# Patient Record
Sex: Male | Born: 1985 | Race: White | Hispanic: No | Marital: Married | Smoking: Former smoker
Health system: Southern US, Community
[De-identification: ages and names within clinical notes are randomized; demographics above are authoritative.]

## PROBLEM LIST (undated history)

## (undated) DIAGNOSIS — B009 Herpesviral infection, unspecified: Secondary | ICD-10-CM

## (undated) DIAGNOSIS — F988 Other specified behavioral and emotional disorders with onset usually occurring in childhood and adolescence: Secondary | ICD-10-CM

## (undated) DIAGNOSIS — S0990XA Unspecified injury of head, initial encounter: Secondary | ICD-10-CM

## (undated) DIAGNOSIS — R413 Other amnesia: Secondary | ICD-10-CM

## (undated) HISTORY — DX: Other amnesia: R41.3

## (undated) HISTORY — DX: Unspecified injury of head, initial encounter: S09.90XA

## (undated) HISTORY — DX: Herpesviral infection, unspecified: B00.9

## (undated) HISTORY — DX: Other specified behavioral and emotional disorders with onset usually occurring in childhood and adolescence: F98.8

---
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
## 2004-05-26 ENCOUNTER — Emergency Department: Payer: Self-pay | Admitting: Emergency Medicine

## 2006-10-03 ENCOUNTER — Inpatient Hospital Stay (HOSPITAL_COMMUNITY): Admission: AD | Admit: 2006-10-03 | Discharge: 2006-10-07 | Payer: Self-pay

## 2006-10-05 ENCOUNTER — Ambulatory Visit: Payer: Self-pay | Admitting: Physical Medicine & Rehabilitation

## 2006-10-07 ENCOUNTER — Inpatient Hospital Stay (HOSPITAL_COMMUNITY)
Admission: RE | Admit: 2006-10-07 | Discharge: 2006-10-28 | Payer: Self-pay | Admitting: Physical Medicine & Rehabilitation

## 2006-10-07 ENCOUNTER — Ambulatory Visit: Payer: Self-pay | Admitting: Physical Medicine & Rehabilitation

## 2006-11-02 ENCOUNTER — Encounter
Admission: RE | Admit: 2006-11-02 | Discharge: 2007-01-31 | Payer: Self-pay | Admitting: Physical Medicine & Rehabilitation

## 2006-11-26 ENCOUNTER — Ambulatory Visit: Payer: Self-pay | Admitting: Physical Medicine & Rehabilitation

## 2006-11-26 ENCOUNTER — Encounter
Admission: RE | Admit: 2006-11-26 | Discharge: 2007-02-24 | Payer: Self-pay | Admitting: Physical Medicine & Rehabilitation

## 2007-01-22 ENCOUNTER — Ambulatory Visit: Payer: Self-pay | Admitting: Physical Medicine & Rehabilitation

## 2007-04-22 ENCOUNTER — Encounter
Admission: RE | Admit: 2007-04-22 | Discharge: 2007-04-23 | Payer: Self-pay | Admitting: Physical Medicine & Rehabilitation

## 2007-04-22 ENCOUNTER — Ambulatory Visit: Payer: Self-pay | Admitting: Physical Medicine & Rehabilitation

## 2007-08-12 ENCOUNTER — Encounter
Admission: RE | Admit: 2007-08-12 | Discharge: 2007-08-13 | Payer: Self-pay | Admitting: Physical Medicine & Rehabilitation

## 2007-08-12 ENCOUNTER — Ambulatory Visit: Payer: Self-pay | Admitting: Physical Medicine & Rehabilitation

## 2008-10-29 IMAGING — CT CT HEAD W/O CM
1 of 2 series · 13 of 30 positions shown, 17 images · IV contrast (agent unspecified)
Comparison: 10/05/2006

CLINICAL DATA: Subdural and subarachnoid hemorrhage status post fall out of bed.    
 HEAD CT WITHOUT CONTRAST:
TECHNIQUE: Contiguous axial images were obtained from the base of the skull through the vertex according to standard protocol without contrast.

[Series 2: head routine 4.8 h47s · axial · 0.44mm/px · z∈[-139,+4]mm · 13 of 36 slices shown, 17 images]
[im 3/36  brain]
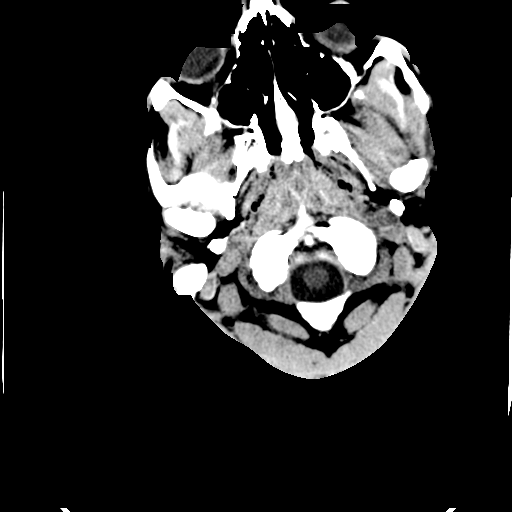
[im 3/36  bone]
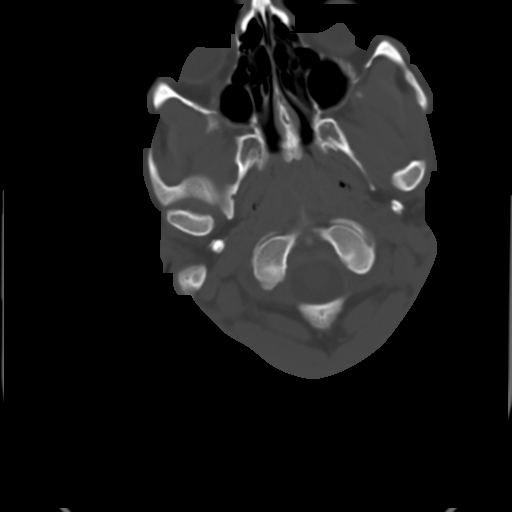
[im 6/36  brain]
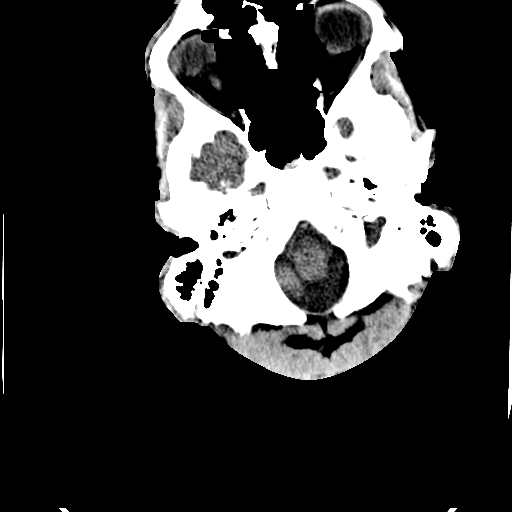
[im 8/36  brain]
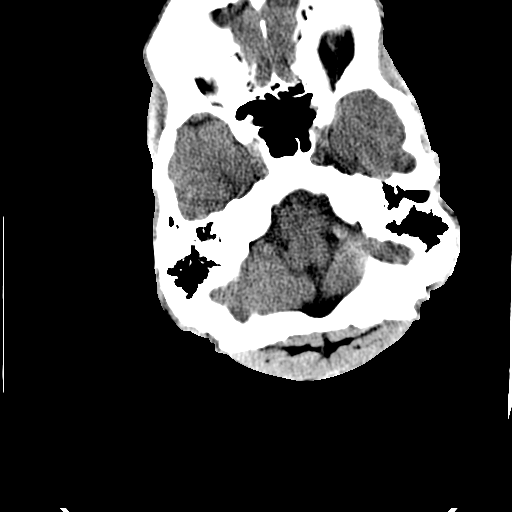
[im 11/36  brain]
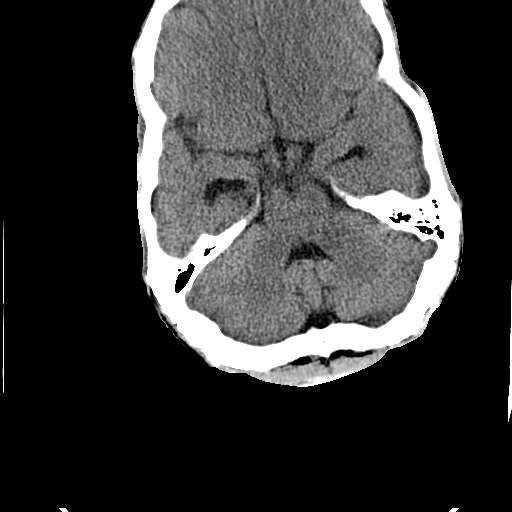
[im 13/36  brain]
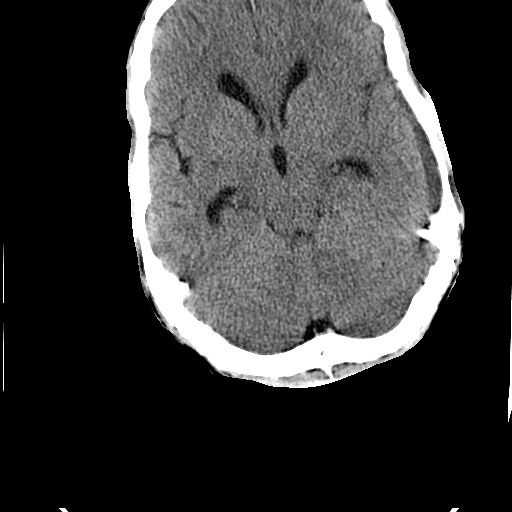
[im 13/36  bone]
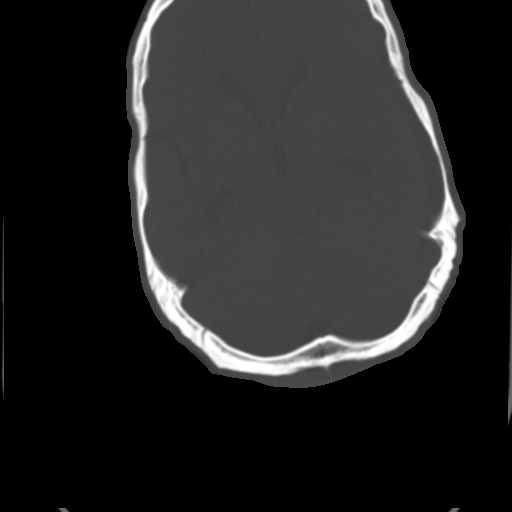
[im 16/36  brain]
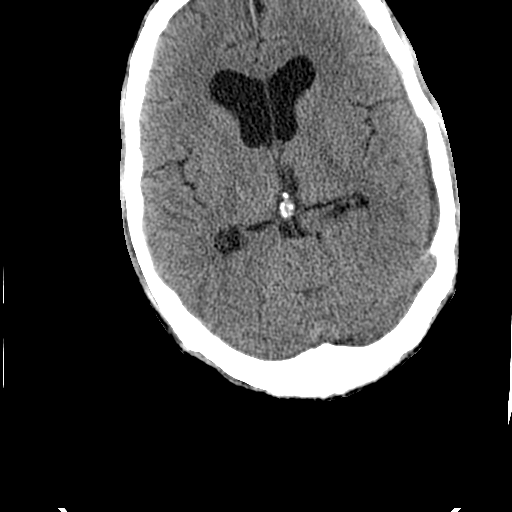
[im 18/36  brain]
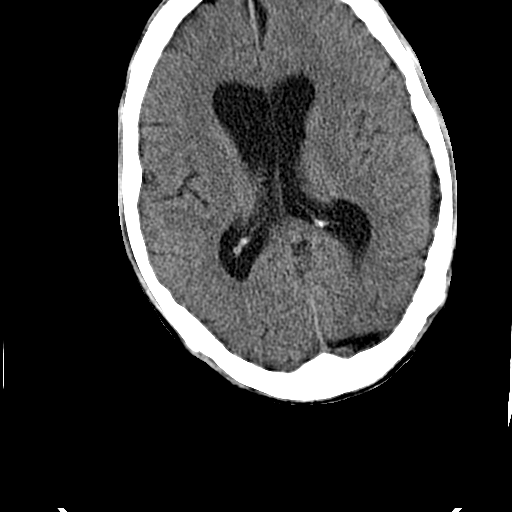
[im 21/36  brain]
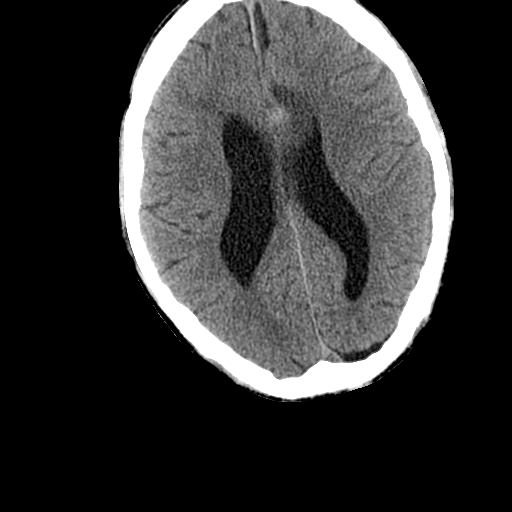
[im 23/36  brain]
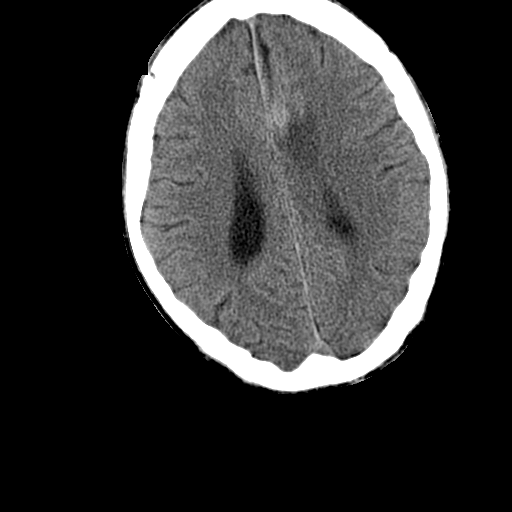
[im 23/36  bone]
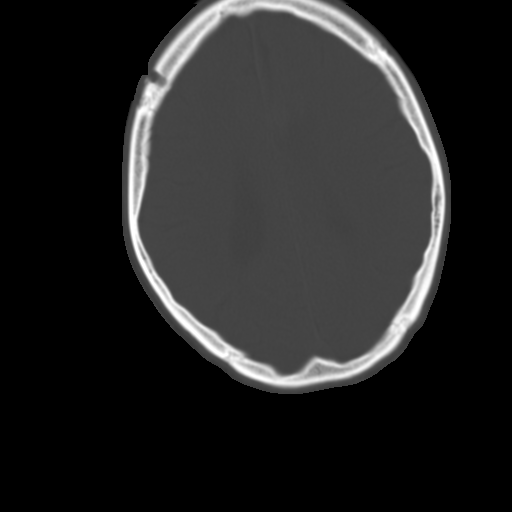
[im 26/36  brain]
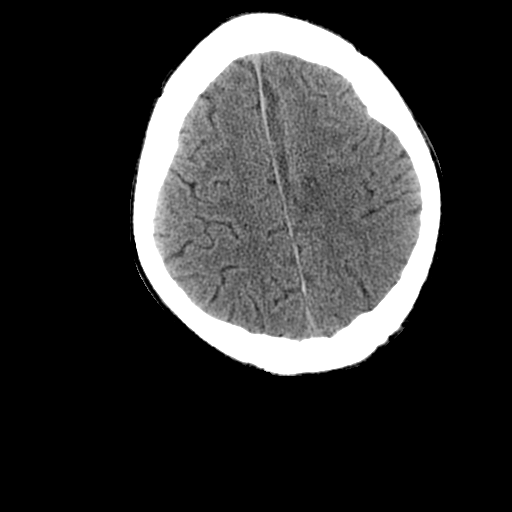
[im 28/36  brain]
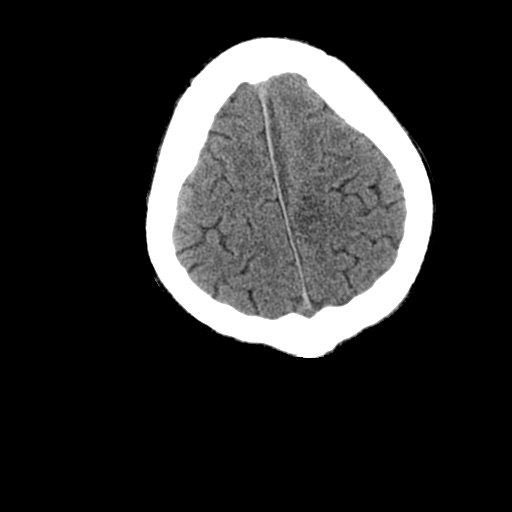
[im 31/36  brain]
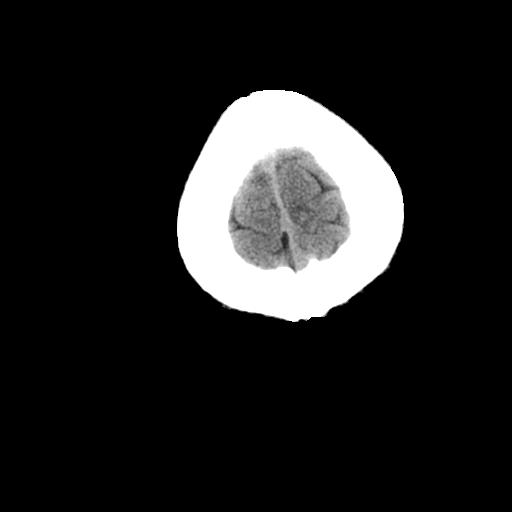
[im 33/36  brain]
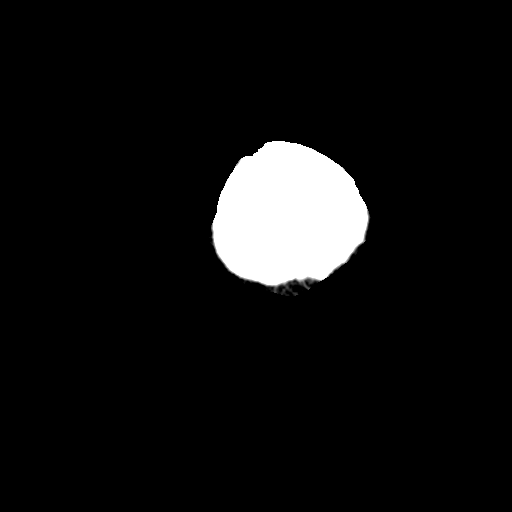
[im 33/36  bone]
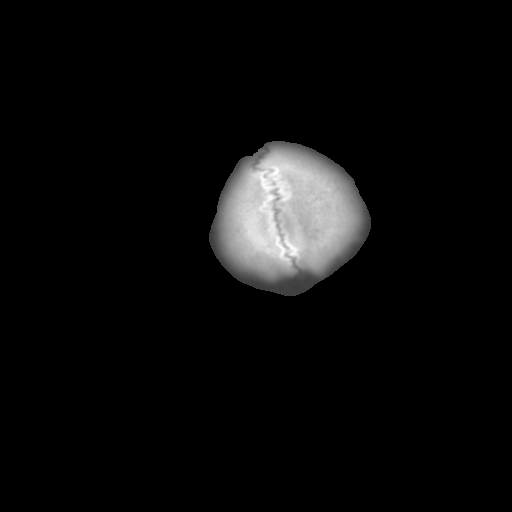

[13 of 30 positions shown; findings below may reference images not displayed]

FINDINGS: Again seen is extraaxial fluid collection over the left convexities and extending along the falx anteriorly.  Degree of high attenuation material within the collection is less and the collection overall is smaller measuring approximately 0.7 cm along the falx superiorly compared to approximately 1.0 cm on the prior study.  Focal hematoma adjacent to the genu of the corpus callosum is essentially unchanged at approximately 1 cm.  There is some surrounding edema.  No new hemorrhage, mass, mass effect, midline shift, or abnormal extraaxial fluid collection.   Ventricles are slightly prominent but not markedly changed.  Tract from prior ventriculostomy shunt catheter or pressure monitor is again noted in the right frontal bone.  Mucous retention cyst versus polyp is seen in the right maxillary sinus and right sphenoid sinus.
IMPRESSION: 1.  Evolutionary change in left subdural hemorrhage without new abnormality. 
 2.  No marked interval change in focal parenchymal hemorrhage in the posteromedial left frontal lobe. 
 3.  Mild prominence of the ventricular system is unchanged.

## 2010-07-21 ENCOUNTER — Encounter: Payer: Self-pay | Admitting: Physical Medicine & Rehabilitation
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
## 2012-02-27 DIAGNOSIS — T799XXS Unspecified early complication of trauma, sequela: Secondary | ICD-10-CM | POA: Diagnosis not present

## 2012-02-27 DIAGNOSIS — F988 Other specified behavioral and emotional disorders with onset usually occurring in childhood and adolescence: Secondary | ICD-10-CM | POA: Diagnosis not present

## 2012-02-27 DIAGNOSIS — F17200 Nicotine dependence, unspecified, uncomplicated: Secondary | ICD-10-CM | POA: Diagnosis not present

## 2013-01-21 DIAGNOSIS — H60509 Unspecified acute noninfective otitis externa, unspecified ear: Secondary | ICD-10-CM | POA: Diagnosis not present

## 2013-05-10 DIAGNOSIS — Z79899 Other long term (current) drug therapy: Secondary | ICD-10-CM | POA: Diagnosis not present

## 2013-05-10 DIAGNOSIS — F988 Other specified behavioral and emotional disorders with onset usually occurring in childhood and adolescence: Secondary | ICD-10-CM | POA: Diagnosis not present

## 2013-06-08 DIAGNOSIS — Z79899 Other long term (current) drug therapy: Secondary | ICD-10-CM | POA: Diagnosis not present

## 2013-06-08 DIAGNOSIS — F988 Other specified behavioral and emotional disorders with onset usually occurring in childhood and adolescence: Secondary | ICD-10-CM | POA: Diagnosis not present

## 2013-09-14 DIAGNOSIS — F988 Other specified behavioral and emotional disorders with onset usually occurring in childhood and adolescence: Secondary | ICD-10-CM | POA: Diagnosis not present

## 2013-09-14 DIAGNOSIS — Z79899 Other long term (current) drug therapy: Secondary | ICD-10-CM | POA: Diagnosis not present

## 2013-11-15 DIAGNOSIS — J309 Allergic rhinitis, unspecified: Secondary | ICD-10-CM | POA: Diagnosis not present

## 2013-11-29 DIAGNOSIS — S0990XA Unspecified injury of head, initial encounter: Secondary | ICD-10-CM | POA: Diagnosis not present

## 2013-11-29 DIAGNOSIS — F988 Other specified behavioral and emotional disorders with onset usually occurring in childhood and adolescence: Secondary | ICD-10-CM | POA: Diagnosis not present

## 2013-12-13 DIAGNOSIS — R413 Other amnesia: Secondary | ICD-10-CM | POA: Diagnosis not present

## 2013-12-13 DIAGNOSIS — S0990XA Unspecified injury of head, initial encounter: Secondary | ICD-10-CM | POA: Diagnosis not present

## 2013-12-13 DIAGNOSIS — S069X5A Unspecified intracranial injury with loss of consciousness greater than 24 hours with return to pre-existing conscious level, initial encounter: Secondary | ICD-10-CM | POA: Diagnosis not present

## 2014-03-01 DIAGNOSIS — Z6826 Body mass index (BMI) 26.0-26.9, adult: Secondary | ICD-10-CM | POA: Diagnosis not present

## 2014-03-01 DIAGNOSIS — F988 Other specified behavioral and emotional disorders with onset usually occurring in childhood and adolescence: Secondary | ICD-10-CM | POA: Diagnosis not present

## 2014-03-01 DIAGNOSIS — S0990XA Unspecified injury of head, initial encounter: Secondary | ICD-10-CM | POA: Diagnosis not present

## 2014-05-30 DIAGNOSIS — Z6827 Body mass index (BMI) 27.0-27.9, adult: Secondary | ICD-10-CM | POA: Diagnosis not present

## 2014-05-30 DIAGNOSIS — S069X9A Unspecified intracranial injury with loss of consciousness of unspecified duration, initial encounter: Secondary | ICD-10-CM | POA: Diagnosis not present

## 2014-06-13 DIAGNOSIS — R0683 Snoring: Secondary | ICD-10-CM | POA: Diagnosis not present

## 2014-06-13 DIAGNOSIS — S069X1D Unspecified intracranial injury with loss of consciousness of 30 minutes or less, subsequent encounter: Secondary | ICD-10-CM | POA: Diagnosis not present

## 2014-06-13 DIAGNOSIS — R413 Other amnesia: Secondary | ICD-10-CM | POA: Diagnosis not present

## 2014-06-19 DIAGNOSIS — R413 Other amnesia: Secondary | ICD-10-CM | POA: Diagnosis not present

## 2014-06-19 DIAGNOSIS — G9389 Other specified disorders of brain: Secondary | ICD-10-CM | POA: Diagnosis not present

## 2014-06-21 DIAGNOSIS — R4182 Altered mental status, unspecified: Secondary | ICD-10-CM | POA: Diagnosis not present

## 2014-06-27 DIAGNOSIS — S069X5A Unspecified intracranial injury with loss of consciousness greater than 24 hours with return to pre-existing conscious level, initial encounter: Secondary | ICD-10-CM | POA: Diagnosis not present

## 2014-06-27 DIAGNOSIS — R0683 Snoring: Secondary | ICD-10-CM | POA: Diagnosis not present

## 2014-06-27 DIAGNOSIS — S0990XD Unspecified injury of head, subsequent encounter: Secondary | ICD-10-CM | POA: Diagnosis not present

## 2014-06-27 DIAGNOSIS — R413 Other amnesia: Secondary | ICD-10-CM | POA: Diagnosis not present

## 2014-08-29 DIAGNOSIS — F900 Attention-deficit hyperactivity disorder, predominantly inattentive type: Secondary | ICD-10-CM | POA: Diagnosis not present

## 2014-08-29 DIAGNOSIS — E785 Hyperlipidemia, unspecified: Secondary | ICD-10-CM | POA: Diagnosis not present

## 2014-08-29 DIAGNOSIS — S069X9A Unspecified intracranial injury with loss of consciousness of unspecified duration, initial encounter: Secondary | ICD-10-CM | POA: Diagnosis not present

## 2014-08-29 DIAGNOSIS — F908 Attention-deficit hyperactivity disorder, other type: Secondary | ICD-10-CM | POA: Diagnosis not present

## 2014-08-29 DIAGNOSIS — Z6827 Body mass index (BMI) 27.0-27.9, adult: Secondary | ICD-10-CM | POA: Diagnosis not present

## 2014-09-09 DIAGNOSIS — R1110 Vomiting, unspecified: Secondary | ICD-10-CM | POA: Diagnosis not present

## 2014-11-28 DIAGNOSIS — S069X9A Unspecified intracranial injury with loss of consciousness of unspecified duration, initial encounter: Secondary | ICD-10-CM | POA: Diagnosis not present

## 2014-11-28 DIAGNOSIS — F900 Attention-deficit hyperactivity disorder, predominantly inattentive type: Secondary | ICD-10-CM | POA: Diagnosis not present

## 2014-11-28 DIAGNOSIS — Z6828 Body mass index (BMI) 28.0-28.9, adult: Secondary | ICD-10-CM | POA: Diagnosis not present

## 2015-01-04 DIAGNOSIS — Z6828 Body mass index (BMI) 28.0-28.9, adult: Secondary | ICD-10-CM | POA: Diagnosis not present

## 2015-01-04 DIAGNOSIS — L559 Sunburn, unspecified: Secondary | ICD-10-CM | POA: Diagnosis not present

## 2015-02-26 DIAGNOSIS — S069X9D Unspecified intracranial injury with loss of consciousness of unspecified duration, subsequent encounter: Secondary | ICD-10-CM | POA: Diagnosis not present

## 2015-02-26 DIAGNOSIS — F900 Attention-deficit hyperactivity disorder, predominantly inattentive type: Secondary | ICD-10-CM | POA: Diagnosis not present

## 2015-02-26 DIAGNOSIS — Z6828 Body mass index (BMI) 28.0-28.9, adult: Secondary | ICD-10-CM | POA: Diagnosis not present

## 2015-05-28 DIAGNOSIS — F900 Attention-deficit hyperactivity disorder, predominantly inattentive type: Secondary | ICD-10-CM | POA: Diagnosis not present

## 2015-05-28 DIAGNOSIS — S069X9D Unspecified intracranial injury with loss of consciousness of unspecified duration, subsequent encounter: Secondary | ICD-10-CM | POA: Diagnosis not present

## 2015-05-28 DIAGNOSIS — Z6828 Body mass index (BMI) 28.0-28.9, adult: Secondary | ICD-10-CM | POA: Diagnosis not present

## 2015-07-10 DIAGNOSIS — R05 Cough: Secondary | ICD-10-CM | POA: Diagnosis not present

## 2015-07-10 DIAGNOSIS — R509 Fever, unspecified: Secondary | ICD-10-CM | POA: Diagnosis not present

## 2015-07-10 DIAGNOSIS — J069 Acute upper respiratory infection, unspecified: Secondary | ICD-10-CM | POA: Diagnosis not present

## 2015-08-27 DIAGNOSIS — Z6828 Body mass index (BMI) 28.0-28.9, adult: Secondary | ICD-10-CM | POA: Diagnosis not present

## 2015-08-27 DIAGNOSIS — F909 Attention-deficit hyperactivity disorder, unspecified type: Secondary | ICD-10-CM | POA: Diagnosis not present

## 2015-08-27 DIAGNOSIS — S069X9D Unspecified intracranial injury with loss of consciousness of unspecified duration, subsequent encounter: Secondary | ICD-10-CM | POA: Diagnosis not present

## 2015-11-22 DIAGNOSIS — F909 Attention-deficit hyperactivity disorder, unspecified type: Secondary | ICD-10-CM | POA: Diagnosis not present

## 2015-11-22 DIAGNOSIS — Z6827 Body mass index (BMI) 27.0-27.9, adult: Secondary | ICD-10-CM | POA: Diagnosis not present

## 2015-11-22 DIAGNOSIS — S069X9D Unspecified intracranial injury with loss of consciousness of unspecified duration, subsequent encounter: Secondary | ICD-10-CM | POA: Diagnosis not present

## 2016-02-20 DIAGNOSIS — S069X9D Unspecified intracranial injury with loss of consciousness of unspecified duration, subsequent encounter: Secondary | ICD-10-CM | POA: Diagnosis not present

## 2016-02-20 DIAGNOSIS — Z6828 Body mass index (BMI) 28.0-28.9, adult: Secondary | ICD-10-CM | POA: Diagnosis not present

## 2016-02-20 DIAGNOSIS — F909 Attention-deficit hyperactivity disorder, unspecified type: Secondary | ICD-10-CM | POA: Diagnosis not present

## 2016-03-18 DIAGNOSIS — S46912A Strain of unspecified muscle, fascia and tendon at shoulder and upper arm level, left arm, initial encounter: Secondary | ICD-10-CM | POA: Diagnosis not present

## 2016-03-18 DIAGNOSIS — S76012A Strain of muscle, fascia and tendon of left hip, initial encounter: Secondary | ICD-10-CM | POA: Diagnosis not present

## 2016-05-21 DIAGNOSIS — F909 Attention-deficit hyperactivity disorder, unspecified type: Secondary | ICD-10-CM | POA: Diagnosis not present

## 2016-05-21 DIAGNOSIS — S069X9D Unspecified intracranial injury with loss of consciousness of unspecified duration, subsequent encounter: Secondary | ICD-10-CM | POA: Diagnosis not present

## 2016-08-20 DIAGNOSIS — F901 Attention-deficit hyperactivity disorder, predominantly hyperactive type: Secondary | ICD-10-CM | POA: Diagnosis not present

## 2016-11-18 DIAGNOSIS — S069X9D Unspecified intracranial injury with loss of consciousness of unspecified duration, subsequent encounter: Secondary | ICD-10-CM | POA: Diagnosis not present

## 2016-11-18 DIAGNOSIS — F909 Attention-deficit hyperactivity disorder, unspecified type: Secondary | ICD-10-CM | POA: Diagnosis not present

## 2017-02-25 DIAGNOSIS — R41840 Attention and concentration deficit: Secondary | ICD-10-CM | POA: Diagnosis not present

## 2017-02-25 DIAGNOSIS — F900 Attention-deficit hyperactivity disorder, predominantly inattentive type: Secondary | ICD-10-CM | POA: Diagnosis not present

## 2017-02-25 DIAGNOSIS — R41844 Frontal lobe and executive function deficit: Secondary | ICD-10-CM | POA: Diagnosis not present

## 2017-02-25 DIAGNOSIS — Z87820 Personal history of traumatic brain injury: Secondary | ICD-10-CM | POA: Diagnosis not present

## 2017-05-16 DIAGNOSIS — J029 Acute pharyngitis, unspecified: Secondary | ICD-10-CM | POA: Diagnosis not present

## 2017-05-29 DIAGNOSIS — Z87820 Personal history of traumatic brain injury: Secondary | ICD-10-CM | POA: Diagnosis not present

## 2017-05-29 DIAGNOSIS — F900 Attention-deficit hyperactivity disorder, predominantly inattentive type: Secondary | ICD-10-CM | POA: Diagnosis not present

## 2017-05-29 DIAGNOSIS — R41840 Attention and concentration deficit: Secondary | ICD-10-CM | POA: Diagnosis not present

## 2017-05-29 DIAGNOSIS — R41844 Frontal lobe and executive function deficit: Secondary | ICD-10-CM | POA: Diagnosis not present

## 2017-09-04 DIAGNOSIS — F900 Attention-deficit hyperactivity disorder, predominantly inattentive type: Secondary | ICD-10-CM | POA: Diagnosis not present

## 2017-09-04 DIAGNOSIS — R41840 Attention and concentration deficit: Secondary | ICD-10-CM | POA: Diagnosis not present

## 2017-09-04 DIAGNOSIS — R41844 Frontal lobe and executive function deficit: Secondary | ICD-10-CM | POA: Diagnosis not present

## 2017-09-04 DIAGNOSIS — Z87820 Personal history of traumatic brain injury: Secondary | ICD-10-CM | POA: Diagnosis not present

## 2017-12-11 DIAGNOSIS — F900 Attention-deficit hyperactivity disorder, predominantly inattentive type: Secondary | ICD-10-CM | POA: Diagnosis not present

## 2017-12-11 DIAGNOSIS — R41844 Frontal lobe and executive function deficit: Secondary | ICD-10-CM | POA: Diagnosis not present

## 2017-12-11 DIAGNOSIS — Z6827 Body mass index (BMI) 27.0-27.9, adult: Secondary | ICD-10-CM | POA: Diagnosis not present

## 2018-03-15 DIAGNOSIS — R41844 Frontal lobe and executive function deficit: Secondary | ICD-10-CM | POA: Diagnosis not present

## 2018-03-15 DIAGNOSIS — Z6826 Body mass index (BMI) 26.0-26.9, adult: Secondary | ICD-10-CM | POA: Diagnosis not present

## 2018-03-15 DIAGNOSIS — F909 Attention-deficit hyperactivity disorder, unspecified type: Secondary | ICD-10-CM | POA: Diagnosis not present

## 2018-06-14 DIAGNOSIS — Z6826 Body mass index (BMI) 26.0-26.9, adult: Secondary | ICD-10-CM | POA: Diagnosis not present

## 2018-06-14 DIAGNOSIS — S069X9D Unspecified intracranial injury with loss of consciousness of unspecified duration, subsequent encounter: Secondary | ICD-10-CM | POA: Diagnosis not present

## 2018-06-14 DIAGNOSIS — R41844 Frontal lobe and executive function deficit: Secondary | ICD-10-CM | POA: Diagnosis not present

## 2018-06-14 DIAGNOSIS — F900 Attention-deficit hyperactivity disorder, predominantly inattentive type: Secondary | ICD-10-CM | POA: Diagnosis not present

## 2018-09-15 DIAGNOSIS — F900 Attention-deficit hyperactivity disorder, predominantly inattentive type: Secondary | ICD-10-CM | POA: Diagnosis not present

## 2018-09-15 DIAGNOSIS — R41844 Frontal lobe and executive function deficit: Secondary | ICD-10-CM | POA: Diagnosis not present

## 2018-09-15 DIAGNOSIS — Z6827 Body mass index (BMI) 27.0-27.9, adult: Secondary | ICD-10-CM | POA: Diagnosis not present

## 2018-12-23 DIAGNOSIS — R41844 Frontal lobe and executive function deficit: Secondary | ICD-10-CM | POA: Diagnosis not present

## 2018-12-23 DIAGNOSIS — F900 Attention-deficit hyperactivity disorder, predominantly inattentive type: Secondary | ICD-10-CM | POA: Diagnosis not present

## 2019-01-20 DIAGNOSIS — B1089 Other human herpesvirus infection: Secondary | ICD-10-CM | POA: Diagnosis not present

## 2019-01-20 DIAGNOSIS — Z1159 Encounter for screening for other viral diseases: Secondary | ICD-10-CM | POA: Diagnosis not present

## 2019-01-20 DIAGNOSIS — Z6826 Body mass index (BMI) 26.0-26.9, adult: Secondary | ICD-10-CM | POA: Diagnosis not present

## 2019-01-20 DIAGNOSIS — B3749 Other urogenital candidiasis: Secondary | ICD-10-CM | POA: Diagnosis not present

## 2019-03-28 DIAGNOSIS — Z87820 Personal history of traumatic brain injury: Secondary | ICD-10-CM | POA: Diagnosis not present

## 2019-03-28 DIAGNOSIS — Z6827 Body mass index (BMI) 27.0-27.9, adult: Secondary | ICD-10-CM | POA: Diagnosis not present

## 2019-03-28 DIAGNOSIS — R41844 Frontal lobe and executive function deficit: Secondary | ICD-10-CM | POA: Diagnosis not present

## 2019-03-28 DIAGNOSIS — F900 Attention-deficit hyperactivity disorder, predominantly inattentive type: Secondary | ICD-10-CM | POA: Diagnosis not present

## 2019-03-28 DIAGNOSIS — Z1159 Encounter for screening for other viral diseases: Secondary | ICD-10-CM | POA: Diagnosis not present

## 2019-05-31 ENCOUNTER — Telehealth: Payer: Self-pay | Admitting: General Practice

## 2019-05-31 NOTE — Telephone Encounter (Signed)
Yes, thanks

## 2019-05-31 NOTE — Telephone Encounter (Signed)
FYI  Patient's mom called today to schedule new patient appointment She stated that her husband and herself are both patients of yours and when her husband Fritz Pickerel was in the office he spoke with you about taking their son as a new patient. Patient has been scheduled

## 2019-06-13 ENCOUNTER — Encounter: Payer: Self-pay | Admitting: Family Medicine

## 2019-06-13 ENCOUNTER — Other Ambulatory Visit: Payer: Self-pay

## 2019-06-13 ENCOUNTER — Ambulatory Visit (INDEPENDENT_AMBULATORY_CARE_PROVIDER_SITE_OTHER): Payer: Medicare Other | Admitting: Family Medicine

## 2019-06-13 VITALS — BP 126/86 | HR 77 | Temp 97.3°F | Ht 72.0 in | Wt 213.3 lb

## 2019-06-13 DIAGNOSIS — S0990XS Unspecified injury of head, sequela: Secondary | ICD-10-CM

## 2019-06-13 DIAGNOSIS — Z7189 Other specified counseling: Secondary | ICD-10-CM

## 2019-06-13 DIAGNOSIS — F988 Other specified behavioral and emotional disorders with onset usually occurring in childhood and adolescence: Secondary | ICD-10-CM | POA: Diagnosis not present

## 2019-06-13 DIAGNOSIS — B009 Herpesviral infection, unspecified: Secondary | ICD-10-CM | POA: Diagnosis not present

## 2019-06-13 DIAGNOSIS — R413 Other amnesia: Secondary | ICD-10-CM | POA: Diagnosis not present

## 2019-06-13 MED ORDER — VALACYCLOVIR HCL 500 MG PO TABS: 500.0000 mg | ORAL_TABLET | Freq: Every day | ORAL | 12 refills | Status: DC | PRN

## 2019-06-13 NOTE — Patient Instructions (Addendum)
Take valtrex once daily for now.  If you have a flare, then take 3 times a day during the flare.  Let me get your records in the meantime.  When you need a refill on adderall, let me know a few days ahead of time (06/27/2019).   Take care.  Glad to see you.
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
## 2019-06-15 ENCOUNTER — Encounter: Payer: Self-pay | Admitting: Family Medicine

## 2019-06-15 DIAGNOSIS — F988 Other specified behavioral and emotional disorders with onset usually occurring in childhood and adolescence: Secondary | ICD-10-CM | POA: Insufficient documentation

## 2019-06-15 DIAGNOSIS — R413 Other amnesia: Secondary | ICD-10-CM | POA: Insufficient documentation

## 2019-06-15 DIAGNOSIS — B009 Herpesviral infection, unspecified: Secondary | ICD-10-CM | POA: Insufficient documentation

## 2019-06-15 DIAGNOSIS — S0990XA Unspecified injury of head, initial encounter: Secondary | ICD-10-CM | POA: Insufficient documentation

## 2019-06-15 DIAGNOSIS — Z7189 Other specified counseling: Secondary | ICD-10-CM | POA: Insufficient documentation
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
## 2019-06-15 NOTE — Assessment & Plan Note (Signed)
He can change to 500 mg daily Valtrex to take 3 times a day only if he has a flare.  Discussed.  He can update me as needed.
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
## 2019-06-15 NOTE — Assessment & Plan Note (Signed)
Advance directive discussed with patient.  Mother designated patient were incapacitated.

## 2019-08-09 ENCOUNTER — Other Ambulatory Visit: Payer: Self-pay | Admitting: Family Medicine

## 2019-08-09 NOTE — Telephone Encounter (Signed)
Patient is requesting a refill   Adderall   Patient is not sure how many he has left.   [facility] [employer] [place]

## 2019-08-10 MED ORDER — AMPHETAMINE-DEXTROAMPHETAMINE 20 MG PO TABS: 20.0000 mg | ORAL_TABLET | Freq: Three times a day (TID) | ORAL | 0 refills | Status: DC

## 2019-08-10 NOTE — Telephone Encounter (Signed)
Patient's mother called back about refill  She stated the patient has moved and now has a new pharmacy. Patient did not mean to give the local pharmacy yesterday and it needed to be corrected     [id number] [place] [place] - [address]

## 2019-08-10 NOTE — Telephone Encounter (Signed)
Name of Medication: Adderall 20 mg Name of Pharmacy: [facility] Last Fill or Written Date and Quantity: ? Hx med; Dr Para March has not previously filled med Last Office Visit and Type: 06/13/2019 establish care Next Office Visit and Type:none scheduled Last Controlled Substance Agreement Date:none  Last FRE:VQWQ

## 2019-08-10 NOTE — Telephone Encounter (Signed)
I sent the prescription under the presumption that the patient would still be able to follow-up here as needed.  If this is not the case then he will need to get set up with another doc.  Thanks.

## 2019-08-11 NOTE — Telephone Encounter (Signed)
Spoke with patient mom, ok per DPR on file, and advised of everything. Mrs Kiner said that patient would follow up here as needed.

## 2019-09-06 ENCOUNTER — Other Ambulatory Visit: Payer: Self-pay | Admitting: Family Medicine

## 2019-09-06 NOTE — Telephone Encounter (Signed)
Name of Medication: Adderall 20 mg  Name of Pharmacy:[facility]  Last Fill or Written Date and Quantity:# 90 on 08/10/19  Last Office Visit and Type: 06/13/19 ADD to establish care Next Office Visit and Type: none scheduled   Request was also left for refill of Valtrex; pt should have available refills for valtrex at [facility]. Unable to speak with pts mom(DPR signed) or pt. When notify pt of Adderall being sent to pharmacy please let him know that [facility]trex [facility] [place] to [facility] pharmacy in [place].

## 2019-09-06 NOTE — Telephone Encounter (Signed)
Patient's mom called to request refill  Adderall & Valtrex   She is not sure how many the patient has left.    [facility] [address]

## 2019-09-07 MED ORDER — AMPHETAMINE-DEXTROAMPHETAMINE 20 MG PO TABS: 20.0000 mg | ORAL_TABLET | Freq: Three times a day (TID) | ORAL | 0 refills | Status: DC

## 2019-09-07 NOTE — Telephone Encounter (Signed)
Patient notified as instructed by telephone and verbalized understanding. 

## 2019-09-07 NOTE — Telephone Encounter (Signed)
Sent. See below.  Thanks.

## 2019-10-05 ENCOUNTER — Other Ambulatory Visit: Payer: Self-pay | Admitting: Family Medicine

## 2019-10-05 MED ORDER — AMPHETAMINE-DEXTROAMPHETAMINE 20 MG PO TABS: 20.0000 mg | ORAL_TABLET | Freq: Three times a day (TID) | ORAL | 0 refills | Status: DC

## 2019-10-05 NOTE — Telephone Encounter (Signed)
Pt needs a refill on his Adderall [facility] [place].

## 2019-10-05 NOTE — Addendum Note (Signed)
Addended by: Joaquim Nam on: 10/05/2019 02:10 PM   Modules accepted: Orders

## 2019-10-05 NOTE — Telephone Encounter (Signed)
Please call patient and schedule appointment as instructed. 

## 2019-10-05 NOTE — Telephone Encounter (Signed)
Name of Medication: Adderall 20 mg  Name of Pharmacy:[facility] [place]  Last Fill or Written Date and Quantity:# 90 on 09/07/19  Last Office Visit and Type: 06/13/19 ADD to establish care Next Office Visit and Type: none scheduled  No UDS or contract on file.

## 2019-10-05 NOTE — Telephone Encounter (Signed)
Sent.  Reasonable to set follow up for this summer.  Thanks.

## 2019-10-05 NOTE — Addendum Note (Signed)
Addended by: Consuella Lose on: 10/05/2019 09:22 AM   Modules accepted: Orders

## 2019-10-06 ENCOUNTER — Telehealth: Payer: Self-pay | Admitting: Family Medicine

## 2019-10-06 NOTE — Telephone Encounter (Signed)
Called patient and left voicemail for patient to call back to schedule a follow up during the summer.

## 2019-10-15 DIAGNOSIS — U071 COVID-19: Secondary | ICD-10-CM | POA: Diagnosis not present

## 2019-10-15 DIAGNOSIS — Z03818 Encounter for observation for suspected exposure to other biological agents ruled out: Secondary | ICD-10-CM | POA: Diagnosis not present

## 2019-10-15 DIAGNOSIS — Z20828 Contact with and (suspected) exposure to other viral communicable diseases: Secondary | ICD-10-CM | POA: Diagnosis not present

## 2019-10-19 NOTE — Telephone Encounter (Signed)
Called patient and left a message for patient to call back and get scheduled for f/u.

## 2019-11-03 ENCOUNTER — Other Ambulatory Visit: Payer: Self-pay | Admitting: Family Medicine

## 2019-11-03 NOTE — Telephone Encounter (Signed)
Name of Medication:Adderall 20 mg  Name of Pharmacy:[facility] [place]  Last Fill or Written Date and Quantity: # 90 on 10/05/19 Last Office Visit and Type:establish care 06/13/19  Next Office Visit and Type: none scheduled

## 2019-11-03 NOTE — Telephone Encounter (Signed)
Patient's wife,Cindy,called to request a refill for patient.  Patient needs a refill for Adderall.  [facility].

## 2019-11-04 MED ORDER — AMPHETAMINE-DEXTROAMPHETAMINE 20 MG PO TABS: 20.0000 mg | ORAL_TABLET | Freq: Three times a day (TID) | ORAL | 0 refills | Status: DC

## 2019-11-04 NOTE — Telephone Encounter (Signed)
ERx 

## 2019-12-07 ENCOUNTER — Other Ambulatory Visit: Payer: Self-pay | Admitting: Family Medicine

## 2019-12-07 MED ORDER — AMPHETAMINE-DEXTROAMPHETAMINE 20 MG PO TABS: 20.0000 mg | ORAL_TABLET | Freq: Three times a day (TID) | ORAL | 0 refills | Status: DC

## 2019-12-07 NOTE — Telephone Encounter (Signed)
Sent. Thanks.   

## 2019-12-07 NOTE — Telephone Encounter (Signed)
Last refill 11/04/19 #90 Last office visit 06/13/19

## 2019-12-07 NOTE — Telephone Encounter (Signed)
Pt needs refill on Adderall sent to [facility]

## 2019-12-22 ENCOUNTER — Encounter: Payer: Self-pay | Admitting: Family Medicine

## 2019-12-22 NOTE — Telephone Encounter (Signed)
Printed out letter with reminder to call office soon to schedule follow up appointment.

## 2020-01-06 ENCOUNTER — Other Ambulatory Visit: Payer: Self-pay | Admitting: Family Medicine

## 2020-01-06 MED ORDER — AMPHETAMINE-DEXTROAMPHETAMINE 20 MG PO TABS: 20.0000 mg | ORAL_TABLET | Freq: Three times a day (TID) | ORAL | 0 refills | Status: DC

## 2020-01-06 NOTE — Telephone Encounter (Signed)
Sent. Thanks.   

## 2020-01-06 NOTE — Telephone Encounter (Signed)
Name of Medication: Adderall 20 mg Name of Pharmacy: [facility] [place] Last Fill or Written Date and Quantity: # 90 on 12/07/19 Last Office Visit and Type: 06/13/19 Est Care ADD Next Office Visit and Type: 01/16/20

## 2020-01-06 NOTE — Telephone Encounter (Signed)
Patient's mother called to request a refill   ADDERALL   She stated the patient is completely out of medication    [facility] [address]

## 2020-01-12 ENCOUNTER — Telehealth: Payer: Self-pay

## 2020-01-12 NOTE — Telephone Encounter (Signed)
LVM that there is no msg in chart that anyone called from this clinic. 

## 2020-01-12 NOTE — Telephone Encounter (Signed)
Sending note to CMA both pts are Dr Duncan's pt. Not sure what call was in reference to. 

## 2020-01-12 NOTE — Telephone Encounter (Signed)
[facility] Night - Client Nonclinical Telephone Record AccessNurse Client [facility] Night - Client Client Site [facility] - Night Physician Duncan, Shaw - MD Contact Type Call Who Is Calling Patient / Member / Family / Caregiver Caller Name [name] Caller Phone Number [phone] Call Type Message Only Information Provided Reason for Call Returning a Call from the Office Initial Comment Caller states he missed a call from the office. Additional Comment his wife is [name] DOB 03/27/1957 and his son [name] DOB 11/07/1984. Provided office hours. He states if the office calls back to leave a message in regards to what the call was for. Disp. Time Disposition Final User 01/11/2020 5:28:23 PM General Information Provided Yes Mclemore, Kendra Call Closed By: Kendra Mclemore Transaction Date/Time: 01/11/2020 5:25:47 PM (ET) 

## 2020-01-16 ENCOUNTER — Other Ambulatory Visit: Payer: Self-pay

## 2020-01-16 ENCOUNTER — Encounter: Payer: Self-pay | Admitting: Family Medicine

## 2020-01-16 ENCOUNTER — Ambulatory Visit (INDEPENDENT_AMBULATORY_CARE_PROVIDER_SITE_OTHER): Payer: Medicare Other | Admitting: Family Medicine

## 2020-01-16 VITALS — BP 122/76 | HR 85 | Temp 97.5°F | Ht 72.0 in | Wt 205.2 lb

## 2020-01-16 DIAGNOSIS — R413 Other amnesia: Secondary | ICD-10-CM | POA: Diagnosis not present

## 2020-01-16 DIAGNOSIS — F988 Other specified behavioral and emotional disorders with onset usually occurring in childhood and adolescence: Secondary | ICD-10-CM

## 2020-01-16 LAB — CBC WITH DIFFERENTIAL/PLATELET
Basophils Absolute: 0.1 10*3/uL (ref 0.0–0.1)
Basophils Relative: 0.9 % (ref 0.0–3.0)
Eosinophils Absolute: 0.2 10*3/uL (ref 0.0–0.7)
Eosinophils Relative: 2.3 % (ref 0.0–5.0)
HCT: 45.3 % (ref 39.0–52.0)
Hemoglobin: 15.3 g/dL (ref 13.0–17.0)
Lymphocytes Relative: 23.1 % (ref 12.0–46.0)
Lymphs Abs: 1.6 10*3/uL (ref 0.7–4.0)
MCHC: 33.7 g/dL (ref 30.0–36.0)
MCV: 82.5 fl (ref 78.0–100.0)
Monocytes Absolute: 0.6 10*3/uL (ref 0.1–1.0)
Monocytes Relative: 8.8 % (ref 3.0–12.0)
Neutro Abs: 4.6 10*3/uL (ref 1.4–7.7)
Neutrophils Relative %: 64.9 % (ref 43.0–77.0)
Platelets: 248 10*3/uL (ref 150.0–400.0)
RBC: 5.49 Mil/uL (ref 4.22–5.81)
RDW: 13.7 % (ref 11.5–15.5)
WBC: 7 10*3/uL (ref 4.0–10.5)

## 2020-01-16 LAB — TSH: TSH: 1.34 u[IU]/mL (ref 0.35–4.50)

## 2020-01-16 LAB — BASIC METABOLIC PANEL
BUN: 8 mg/dL (ref 6–23)
CO2: 30 mEq/L (ref 19–32)
Calcium: 9.7 mg/dL (ref 8.4–10.5)
Chloride: 103 mEq/L (ref 96–112)
Creatinine, Ser: 0.92 mg/dL (ref 0.40–1.50)
GFR: 93.78 mL/min (ref >60.00)
Glucose, Bld: 89 mg/dL (ref 70–99)
Potassium: 4.1 mEq/L (ref 3.5–5.1)
Sodium: 140 mEq/L (ref 135–145)

## 2020-01-16 MED ORDER — AMPHETAMINE-DEXTROAMPHETAMINE 20 MG PO TABS: 20.0000 mg | ORAL_TABLET | Freq: Three times a day (TID) | ORAL | 0 refills | Status: DC

## 2020-01-16 NOTE — Progress Notes (Signed)
This visit occurred during the SARS-CoV-2 public health emergency.  Safety protocols were in place, including screening questions prior to the visit, additional usage of staff PPE, and extensive cleaning of exam room while observing appropriate contact time as indicated for disinfecting solutions.  ADD Compliant with meds: yes, still on adderall.   benefit from med (ie increase in concentration): yes- he clearly does worse off adderall.   change in mood: no change in appetite: no Insomnia: no Tremor: no compliant with behavioral modification: he is making lists.   Labs pending.  He isn't worse overall in the recent past.    Advance directive discussed with patient.  Mother designated patient were incapacitated.  He is driving well, not getting lost. No red flag events.  Not leaving the stove on, etc.  Cautions d/w pt.    covid vaccine d/w pt, encouraged.   ROS: Per HPI unless specifically indicated in ROS section   Meds, vitals, and allergies reviewed.   GEN: nad, alert and oriented, affect wnl and appropriate HEENT: ncat NECK: supple w/o LA CV: rrr.  PULM: ctab, no inc wob ABD: soft, +bs EXT: no edema CN 2-12 wnl, s/s/dtr wnl B.  No tremor.  Oriented to year, month, day.  3/3 attention.   Can to math.   Can read a watch 3/3 recall.   WORLD--->DLROW.

## 2020-01-16 NOTE — Patient Instructions (Addendum)
Ask the front to update your address in [place].    Take care.  Glad to see you.  Go to the lab on the way out.   If you have mychart we'll likely use that to update you.     I would get a covid vaccine whenever possible.

## 2020-01-18 NOTE — Assessment & Plan Note (Addendum)
With some short-term memory changes noted but he is compensating and still okay for outpatient follow-up.  No adverse effect on Adderall.  Continue as is.  Routine healthy habits encouraged.  Covid vaccination encouraged.  See notes on routine labs.  He agrees with plan.

## 2020-05-08 ENCOUNTER — Telehealth: Payer: Self-pay | Admitting: Family Medicine

## 2020-05-08 ENCOUNTER — Other Ambulatory Visit: Payer: Self-pay | Admitting: Family Medicine

## 2020-05-08 NOTE — Telephone Encounter (Signed)
Hey Dr Para March,  Patients mom called for refill on:  Adderrall 20 mg LR 04/05/20, 0 rf LOV 01/16/20 FOV none scheduled.   Please review and advise.   Thanks.   Dm/cma

## 2020-05-08 NOTE — Telephone Encounter (Signed)
Refill request Valtrex Last refill 06/12/20 #30/12 Last office visit 01/16/20

## 2020-05-08 NOTE — Telephone Encounter (Signed)
Cindy (mom) called pt needing to get a refill on addreall  [facility] [place]

## 2020-05-09 MED ORDER — AMPHETAMINE-DEXTROAMPHETAMINE 20 MG PO TABS: 20.0000 mg | ORAL_TABLET | Freq: Three times a day (TID) | ORAL | 0 refills | Status: DC

## 2020-05-09 NOTE — Telephone Encounter (Signed)
Sent. Thanks.   

## 2020-05-10 ENCOUNTER — Telehealth: Payer: Self-pay | Admitting: Family Medicine

## 2020-05-10 MED ORDER — AMPHETAMINE-DEXTROAMPHETAMINE 20 MG PO TABS: 20.0000 mg | ORAL_TABLET | Freq: Three times a day (TID) | ORAL | 0 refills | Status: DC

## 2020-05-10 NOTE — Telephone Encounter (Signed)
Pt's Mom called and says prescription for Adderall was sent to wrong place yesterday.  [facility] [place], [place].  This is where he now gets his meds filled.  Please re-send the prescription to [facility]. Pt is completely out of medication.  Thank you!

## 2020-05-10 NOTE — Telephone Encounter (Signed)
Resent.  Thanks.

## 2020-06-11 ENCOUNTER — Telehealth: Payer: Self-pay | Admitting: Family Medicine

## 2020-06-11 NOTE — Telephone Encounter (Signed)
Please Advise

## 2020-06-11 NOTE — Telephone Encounter (Signed)
PT MOM CALLED IN NEEDED TO GET A REFILL ON ADDERALL 20MG   AND [facility] [address] [place] , [place], THIS IS THE [place] LOCATION

## 2020-06-12 NOTE — Telephone Encounter (Signed)
Thanks

## 2020-06-12 NOTE — Telephone Encounter (Signed)
[facility] [address] [place], [place] [address].   Call pharmacy.  He should already have rxs there for Dec and Jan, prev sent.

## 2020-06-12 NOTE — Telephone Encounter (Signed)
Patient called back to office and updated of previous information.

## 2020-06-12 NOTE — Telephone Encounter (Signed)
[facility] and confirmed that they have the prescription for Adderall 20 mg for the month of December and January. Attempted to call patients mother back, but the voicemail box was not set up. Attempted to call patient, but the voicemail box was full. [facility] stated that [facility] will [facility] order ready for the patient.

## 2020-08-14 ENCOUNTER — Telehealth: Payer: Self-pay | Admitting: Family Medicine

## 2020-08-14 NOTE — Telephone Encounter (Signed)
LOV - 01/16/20 Next OV - not scheduled Last refilled - 05/10/20 #90/0

## 2020-08-14 NOTE — Telephone Encounter (Signed)
Patient needs refill on Adderall  Pharmacy [facility] [place]

## 2020-08-15 MED ORDER — AMPHETAMINE-DEXTROAMPHETAMINE 20 MG PO TABS: 20.0000 mg | ORAL_TABLET | Freq: Three times a day (TID) | ORAL | 0 refills | Status: DC

## 2020-08-15 NOTE — Addendum Note (Signed)
Addended by: Joaquim Nam on: 08/15/2020 02:12 PM   Modules accepted: Orders

## 2020-08-15 NOTE — Telephone Encounter (Signed)
Thanks

## 2020-08-15 NOTE — Telephone Encounter (Signed)
Called patient and he is on the books for 12/04/20 at 2 pm

## 2020-08-15 NOTE — Telephone Encounter (Signed)
Sent. Thanks.  Needs follow-up scheduled for the summer.

## 2020-11-27 ENCOUNTER — Telehealth: Payer: Self-pay

## 2020-11-27 NOTE — Telephone Encounter (Signed)
[facility] Night - Client TELEPHONE ADVICE RECORD AccessNurse Patient Name: [name] Gender: Male DOB: [birth year] Age: 35 Y 7 M Return Phone Number: [phone] (Primary), [phone] (Secondary) Address: City/ State/ Zip: [place] Client [facility] Night - Client Client Site [facility] - Night Physician [facility] - PHYSICIAN, NOT LISTED- MD Contact Type Call Who Is Calling Patient / Member / Family / Caregiver Call Type Triage / Clinical Caller Name Ezreal Turay Relationship To Patient Mother Return Phone Number [phone] (Secondary) Chief Complaint Prescription Refill or Medication Request (non symptomatic) Reason for Call Medication Question / Request Initial Comment Caller states she is needing a medication refill for her son since he will be out of medication. Location verified , [place]. Translation No Disp. Time Lamount Cohen Time) Disposition Final User 11/26/2020 9:18:10 AM Send To Nurse Colstrip Sink, RN, April 11/26/2020 9:19:21 AM FINAL ATTEMPT MADE - no message left Emch, RN, Kathlene November 11/26/2020 9:19:27 AM Send to RN Final Attempt Gillermo Murdoch, RN, Kathlene November 11/26/2020 9:21:01 AM Send To Nurse [place]ailles Sink, RN, April 11/26/2020 9:46:53 AM Send To RN Personal Annye English, RN, Denise 11/26/2020 9:40:22 AM FINAL ATTEMPT MADE - message left

## 2020-11-27 NOTE — Telephone Encounter (Signed)
Patient needs refill on adderall   LOV 01/16/20 NOV 12/04/20 RF 08/15/20 x3 #90/0

## 2020-11-27 NOTE — Telephone Encounter (Signed)
[facility] Night - Client TELEPHONE ADVICE RECORD AccessNurse Patient Name: [name] Gender: Male DOB: May 11, 1986 Age: 35 Y 7 M Return Phone Number: [phone] (Secondary) Address: City/ State/ Zip: [place] Client [facility] Night - Client Client Site [facility] - Night Physician AA - PHYSICIAN, NOT LISTED- MD Contact Type Call Who Is Calling Patient / Member / Family / Caregiver Call Type Triage / Clinical Caller Name Melinda Pottinger Relationship To Patient Mother Return Phone Number [phone] (Secondary) Chief Complaint Prescription Refill or Medication Request (non symptomatic) Reason for Call Symptomatic / Request for Health Information Initial Comment Caller states she needs a refill for her son's Adderall. Provider is Fischer. [facility] [place] [facility] Translation No No Triage Reason Patient declined Nurse Assessment Nurse: Reasor, RN, Leah Date/Time (Eastern Time): 11/26/2020 4:37:38 PM Confirm and document reason for call. If symptomatic, describe symptoms. ---Caller states he needs a refill rx for Adderall 20 mg PO TID per Dr[facility] [place] Pt states he is completely out of medication. Pt states he can't focus, but refusing further assessment and triage. Pt informed that controlled medications are not handled after hours per policy, instructed to call office in AM to get refill rx sent in ASAP Does the patient have any new or worsening symptoms? ---Yes Will a triage be completed? ---No Select reason for no triage. ---Patient declined Disp. Time Lamount Cohen Time) Disposition Final User 11/26/2020 1:08:36 PM Send To Nurse [place] Sink, RN, April 11/26/2020 4:34:49 PM Send To Call Back Waiting For Nurse Ardelle Anton 11/26/2020 4:42:59 PM Clinical Call Yes Reasor, RN, Tacey Ruiz PLEASE NOTE: All timestamps contained within this  report are represented as [place] Standard Time. CONFIDENTIALTY NOTICE: This fax transmission is intended only for the addressee. It contains information that is legally privileged, confidential or otherwise protected from use or disclosure. If you are not the intended recipient, you are strictly prohibited from reviewing, disclosing, copying using or disseminating any of this information or taking any action in reliance on or regarding this information. If you have received this fax in error, please notify us immediately by telephone so that we can arrange for its return to [place]. Phone: [phone], Toll-Free: [phone], Fax: [phone] Page: 2 of 2 Call Id: [id number] Comments User: Ardelle Anton Date/Time Lamount Cohen Time): 11/26/2020 4:34:28 PM CBWN- Mom calling for status of call back.

## 2020-11-28 MED ORDER — AMPHETAMINE-DEXTROAMPHETAMINE 20 MG PO TABS: 20.0000 mg | ORAL_TABLET | Freq: Three times a day (TID) | ORAL | 0 refills | Status: DC

## 2020-11-28 NOTE — Addendum Note (Signed)
Addended by: Joaquim Nam on: 11/28/2020 10:18 AM   Modules accepted: Orders

## 2020-11-28 NOTE — Telephone Encounter (Signed)
Sent. Thanks.   

## 2020-12-04 ENCOUNTER — Other Ambulatory Visit: Payer: Self-pay

## 2020-12-04 ENCOUNTER — Ambulatory Visit (INDEPENDENT_AMBULATORY_CARE_PROVIDER_SITE_OTHER): Payer: Medicare HMO | Admitting: Family Medicine

## 2020-12-04 ENCOUNTER — Encounter: Payer: Self-pay | Admitting: Family Medicine

## 2020-12-04 VITALS — BP 138/84 | HR 78 | Temp 96.2°F | Ht 72.0 in | Wt 202.0 lb

## 2020-12-04 DIAGNOSIS — F988 Other specified behavioral and emotional disorders with onset usually occurring in childhood and adolescence: Secondary | ICD-10-CM

## 2020-12-04 DIAGNOSIS — Z23 Encounter for immunization: Secondary | ICD-10-CM | POA: Diagnosis not present

## 2020-12-04 DIAGNOSIS — Z7189 Other specified counseling: Secondary | ICD-10-CM

## 2020-12-04 DIAGNOSIS — R69 Illness, unspecified: Secondary | ICD-10-CM | POA: Diagnosis not present

## 2020-12-04 NOTE — Progress Notes (Signed)
This visit occurred during the SARS-CoV-2 public health emergency.  Safety protocols were in place, including screening questions prior to the visit, additional usage of staff PPE, and extensive cleaning of exam room while observing appropriate contact time as indicated for disinfecting solutions.  ADD, h/o TBI.  Compliant with meds: yes, still on adderall.  benefit from med (ie increase in concentration): yes- he clearly does worse off adderall.  "I can't concentrate without it."   change in mood: no  change in appetite: no  Insomnia: no  Tremor: faint R 2nd finger tremor with hand in extension- not noted agin on recheck.  No tremor at rest.   compliant with behavioral modification: he is making lists.  He isn't worse overall in the recent past. He is compensating with lists, etc He is driving well, not getting lost. No red flag events. Not leaving the stove on, etc. Cautions d/w pt.  All routine vaccines encouraged.  Still out of disability.  He got married last year.  Doing well at home.   Advance directive discussed with patient. Wife designated if patient were incapacitated.  PMH and SH reviewed  ROS: Per HPI unless specifically indicated in ROS section   Meds, vitals, and allergies reviewed.   GEN: nad, alert and oriented, affect wnl and appropriate HEENT: ncat NECK: supple w/o LA CV: rrr.  PULM: ctab, no inc wob ABD: soft, +bs EXT: no edema CN 2-12 wnl, s/s/dtr wnl x4.  No tremor.

## 2020-12-04 NOTE — Patient Instructions (Signed)
Don't change your meds for now. Update me as needed.  Recheck yearly.  Tetanus shot today.  Take care.  Glad to see you.

## 2020-12-04 NOTE — Assessment & Plan Note (Signed)
Advance directive discussed with patient.  Wife designated if patient were incapacitated. 

## 2020-12-05 NOTE — Assessment & Plan Note (Addendum)
Continue Adderall.  No adverse effect on medication.  He only noticed right second finger tremor intermittently during the exam today upon testing.  He had not noticed it otherwise.  He can monitor that.  At this point it makes sense to continue his medications as is and he agrees with plan.  Recheck periodically.  Would continue behavioral modifications, such as making lists.  Tetanus shot done at office.  Flu and COVID-vaccine encouraged.

## 2021-03-11 ENCOUNTER — Telehealth: Payer: Self-pay | Admitting: *Deleted

## 2021-03-11 NOTE — Telephone Encounter (Signed)
PLEASE NOTE: All timestamps contained within this report are represented as [place] Standard Time. CONFIDENTIALTY NOTICE: This fax transmission is intended only for the addressee. It contains information that is legally privileged, confidential or otherwise protected from use or disclosure. If you are not the intended recipient, you are strictly prohibited from reviewing, disclosing, copying using or disseminating any of this information or taking any action in reliance on or regarding this information. If you have received this fax in error, please notify us immediately by telephone so that we can arrange for its return to [place]. Phone: [phone], Toll-Free: [phone], Fax: [phone] Page: 1 of 1 Call Id: [id number] [facility] Night - Client Nonclinical Telephone Record  [facility]urse Client [facility] Night - Client Client Site [facility] [place] - Night Physician Raechel Ache - MD Contact Type Call Who Is Calling Patient / Member / Family / Caregiver Caller Name Aleck Locklin Caller Phone Number [phone] Patient Name [name] Patient DOB 17-Nov-1985 Call Type Message Only Information Provided Reason for Call Medication Question / Request Initial Comment Caller needing a medication refill for Adderall Disp. Time Disposition Final User 03/11/2021 8:12:55 AM General Information Provided Yes Tiburcio Pea, Lanette Call Closed By: Evette Doffing Transaction Date/Time: 03/11/2021 8:10:46 AM (ET)

## 2021-03-12 NOTE — Telephone Encounter (Signed)
Pt Dad called about his refill on ADDERALL) 20 MG tablet [[id number]]

## 2021-03-13 MED ORDER — AMPHETAMINE-DEXTROAMPHETAMINE 20 MG PO TABS: 20.0000 mg | ORAL_TABLET | Freq: Three times a day (TID) | ORAL | 0 refills | Status: DC

## 2021-03-13 NOTE — Telephone Encounter (Signed)
Patient's wife called stating that her husband is out of his medication.  Patient's wife stated that she had a hard time getting through on the phones. Patient's wife was advised that we have had to close the [facility] office temporarily and things have been challenging this week.  Advised patient's wife that Dr. Para March is out the office today but he does look at his messages. Patient's wife was advised that the medication should be refilled today.

## 2021-03-13 NOTE — Telephone Encounter (Signed)
Sent. Thanks.   

## 2021-03-13 NOTE — Telephone Encounter (Signed)
Name of Medication: Adderall 20 mg Name of Pharmacy: [facility] Last Fill or Written Date and Quantity: # 90 x 2 on 11/28/20 Last Office Visit and Type: 12/04/2020 med refill Next Office Visit and Type: none scheduled

## 2021-03-13 NOTE — Telephone Encounter (Signed)
[facility] Night - Client TELEPHONE ADVICE RECORD AccessNurse Patient Name: SQUIRE WITHEY Gender: Male DOB: 04/14/1986 Age: 35 Y 10 M 15 D Return Phone Number: [phone] (Primary) Address: City/State/Zip: [place] Client [facility] Night - Client Client Site [facility] - Night Physician Raechel Ache - MD Contact Type Call Who Is Calling Patient / Member / Family / Caregiver Call Type Triage / Clinical Caller Name Kasin Tonkinson Relationship To Patient Father Return Phone Number [phone] (Primary) Chief Complaint Prescription Refill or Medication Request (non symptomatic) Reason for Call Medication Question / Request Initial Comment The caller states that he called earlier to request an Adderall refill for his son; however, the pharmacy has yet to receive it. The caller verified that his son is not symptomatic other than having troubles concentrating. Translation No Nurse Assessment Guidelines Guideline Title Affirmed Question Affirmed Notes Nurse Date/Time (Eastern Time) Disp. Time Lamount Cohen Time) Disposition Final User 03/13/2021 7:27:54 AM Send to RN Final Attempt Orlene Och, RN, Rinaldo Cloud 03/13/2021 7:27:46 AM FINAL ATTEMPT MADE - message left Yes Conner, RN, Rinaldo Cloud

## 2021-06-18 ENCOUNTER — Other Ambulatory Visit: Payer: Self-pay | Admitting: Family Medicine

## 2021-06-18 NOTE — Telephone Encounter (Signed)
LOV - 12/04/20 Next OV - not scheduled Last refill - 03/13/21 #90/0 x 3

## 2021-06-18 NOTE — Telephone Encounter (Signed)
Pt called requesting refill on Adderall

## 2021-06-19 MED ORDER — AMPHETAMINE-DEXTROAMPHETAMINE 20 MG PO TABS: 20.0000 mg | ORAL_TABLET | Freq: Three times a day (TID) | ORAL | 0 refills | Status: DC

## 2021-06-19 NOTE — Telephone Encounter (Signed)
Sent. Thanks.   

## 2021-06-19 NOTE — Addendum Note (Signed)
Addended by: Joaquim Nam on: 06/19/2021 12:53 PM   Modules accepted: Orders

## 2021-06-19 NOTE — Telephone Encounter (Signed)
°  Encourage patient to contact the pharmacy for refills or they can request refills through [facility]  LAST APPOINTMENT DATE:  Please schedule appointment if longer than 1 year  NEXT APPOINTMENT DATE:  MEDICATION:amphetamine-dextroamphetamine (ADDERALL) 20 MG tablet  Is the patient out of medication?   [id number] [place] [place] [address]   Let patient know to contact pharmacy at the end of the day to make sure medication is ready.  Please notify patient to allow 48-72 hours to process  CLINICAL FILLS OUT ALL BELOW:   LAST REFILL:  QTY:  REFILL DATE:    OTHER COMMENTS:    Okay for refill?  Please advise

## 2021-06-20 MED ORDER — AMPHETAMINE-DEXTROAMPHETAMINE 20 MG PO TABS: 20.0000 mg | ORAL_TABLET | Freq: Three times a day (TID) | ORAL | 0 refills | Status: DC

## 2021-06-20 NOTE — Addendum Note (Signed)
Addended by: Patience Musca on: 06/20/2021 10:49 AM   Modules accepted: Orders

## 2021-06-20 NOTE — Telephone Encounter (Signed)
Sent. Thanks.   

## 2021-06-20 NOTE — Telephone Encounter (Signed)
[facility] Night - Client TELEPHONE ADVICE RECORD AccessNurse Patient Name: RENELL ALLUM Gender: Male DOB: 1985-12-03 Age: 35 Y 1 M 22 D Return Phone Number: [phone] (Primary) Address: City[place]  [address] Client [facility] Night - Client Client Site [facility] - Night Provider Raechel Ache - MD Contact Type Call Who Is Calling Patient / Member / Family / Caregiver Call Type Triage / Clinical Caller Name Myrick Mcnairy Relationship To Patient Mother Return Phone Number [phone] (Primary) Chief Complaint Prescription Refill or Medication Request (non symptomatic) Reason for Call Symptomatic / Request for Health Information Initial Comment Caller states she is needing an rx transferred to a different pharmacy because they don't have it at the one it was sent to. Caller states it is for Adderall. Translation No Disp. Time Lamount Cohen Time) Disposition Final User 06/19/2021 5:52:29 PM Send to Clinical [facility], RN, Philis Pique 06/19/2021 6:18:19 PM Attempt made - message left Durwin Nora, RNMorrie Sheldon 06/19/2021 6:33:09 PM FINAL ATTEMPT MADE - message left Conse[facility] Hearing 06/19/2021 6:33:18 PM Send to RN Final Attempt Oralia Manis, RN, Morrie Sheldon 06/20/2021 9:41:32 AM Attempt made - message left Nunzio Cory, RN, Sherrie 06/20/2021 9:52:43 AM FINAL ATTEMPT MADE - message left Yes Nunzio Cory, RN, Roanna Raider

## 2021-06-20 NOTE — Addendum Note (Signed)
Addended by: Patience Musca on: 06/20/2021 11:11 AM   Modules accepted: Orders

## 2021-06-20 NOTE — Telephone Encounter (Signed)
I spoke with [name] at [facility] and they will not get Adderall in until 07/02/21. Pts mom said that pt is out of Adderall. [facility]  [address] [place], [place]. Request one rx of adderall only be sent to [facility] [place]. Sending note to DR Para March.

## 2021-06-20 NOTE — Telephone Encounter (Signed)
Pts mom called back and said that pt had cked on 06/19/21 with [facility] [place] and they did have rx but when called back after spoke with me this morning the [facility] is out of med. Pts mom request one rx only of Adderall be sent to [facility] which does have med this morning. Thank you.

## 2021-06-20 NOTE — Addendum Note (Signed)
Addended by: Joaquim Nam on: 06/20/2021 01:55 PM   Modules accepted: Orders

## 2021-07-02 ENCOUNTER — Other Ambulatory Visit: Payer: Self-pay

## 2021-07-02 ENCOUNTER — Telehealth: Payer: Medicare HMO | Admitting: Family Medicine

## 2021-07-23 ENCOUNTER — Telehealth: Payer: Self-pay | Admitting: Family Medicine

## 2021-07-23 NOTE — Telephone Encounter (Signed)
°  Encourage patient to contact the pharmacy for refills or they can request refills through [facility]:  Please schedule appointment if longer than 1 year  NEXT APPOINTMENT DATE:no future appt  MEDICATION:amphetamine-dextroamphetamine (ADDERALL) 20 MG tablet  Is the patient out of medication?   [id number] [place], [address]  Let patient know to contact pharmacy at the end of the day to make sure medication is ready.  Please notify patient to allow 48-72 hours to process  CLINICAL FILLS OUT ALL BELOW:   LAST REFILL:  QTY:  REFILL DATE:    OTHER COMMENTS:    Okay for refill?  Please advise

## 2021-07-23 NOTE — Telephone Encounter (Signed)
Cooper Render left refill request for Adderall 20 mg tablet. On 06/19/2021 # 90 with 2 additional rx for Jan 2023 and Feb 2023. I [facility] with Rob at [facility] and he said that they do not have Adderall in any mg at that CVS so they do not have any adderall substitutions with different mgs. Rob said med on back order and does not know of any pharmacies in their area with adderall. Sending note to Dr Para March.

## 2021-07-24 MED ORDER — AMPHETAMINE-DEXTROAMPHETAMINE 10 MG PO TABS: ORAL_TABLET | ORAL | 0 refills | Status: DC

## 2021-07-24 NOTE — Telephone Encounter (Signed)
Rx sent for 10mg  tabs, rx updated.  See if he can get that and then go from there.  Thanks.

## 2021-07-24 NOTE — Telephone Encounter (Signed)
Tried to call patients mom back and no answer and no VM; tried to call patient and LMTCB

## 2021-07-24 NOTE — Addendum Note (Signed)
Addended by: Tonia Ghent on: 07/24/2021 04:50 PM   Modules accepted: Orders

## 2021-07-24 NOTE — Telephone Encounter (Signed)
[facility] [place]. They only have generic adderall 10 mg in stock; concerta is on back order and they have a very very low supply of vyvanse that she stated will be gone before a new rx would be sent in.

## 2021-07-24 NOTE — Telephone Encounter (Signed)
Does the pharmacy have any other stimulants on hand that could be substituted?  Ritalin vs vyvanse vs etc?  Please let me know . Thanks.

## 2021-07-26 ENCOUNTER — Other Ambulatory Visit: Payer: Self-pay | Admitting: Family Medicine

## 2021-07-26 NOTE — Telephone Encounter (Signed)
°  Encourage patient to contact the pharmacy for refills or they can request refills through [facility]  LAST APPOINTMENT DATE:  Please schedule appointment if longer than 1 year  NEXT APPOINTMENT DATE:   MEDICATION: amphetamine-dextroamphetamine (ADDERALL) 10 MG tablet  Is the patient out of medication? yes  [facility] [address], [place], [place] [address]  Let patient know to contact pharmacy at the end of the day to make sure medication is ready.  Please notify patient to allow 48-72 hours to process  CLINICAL FILLS OUT ALL BELOW:   LAST REFILL:  QTY:  REFILL DATE:    OTHER COMMENTS:    Okay for refill?  Please advise

## 2021-07-26 NOTE — Telephone Encounter (Signed)
Duplicate request; emr wont let me deny

## 2021-07-26 NOTE — Telephone Encounter (Signed)
Order removed.  Thanks.

## 2021-07-29 ENCOUNTER — Telehealth: Payer: Self-pay | Admitting: Family Medicine

## 2021-07-29 MED ORDER — AMPHETAMINE-DEXTROAMPHETAMINE 10 MG PO TABS: ORAL_TABLET | ORAL | 0 refills | Status: DC

## 2021-07-29 NOTE — Telephone Encounter (Signed)
°  Encourage patient to contact the pharmacy for refills or they can request refills through [facility]  LAST APPOINTMENT DATE:  Please schedule appointment if longer than 1 year  NEXT APPOINTMENT DATE:no future appt MEDICATION:amphetamine-dextroamphetamine (ADDERALL) 10 MG tablet  Is the patient out of medication?   PHARMACY:[facility] [address] [place] [address]  Let patient know to contact pharmacy at the end of the day to make sure medication is ready.  Please notify patient to allow 48-72 hours to process  CLINICAL FILLS OUT ALL BELOW:   LAST REFILL:  QTY:  REFILL DATE:    OTHER COMMENTS:    Okay for refill?  Please advise

## 2021-07-29 NOTE — Telephone Encounter (Signed)
[name] with [facility] called stating that pt insurance will not cover the 20mg  180 tablets but they will cover the 20mg  90 tablets. Please advise.

## 2021-07-29 NOTE — Telephone Encounter (Signed)
Sent. Thanks.   

## 2021-07-29 NOTE — Addendum Note (Signed)
Addended by: Joaquim Nam on: 07/29/2021 02:04 PM   Modules accepted: Orders

## 2021-07-30 MED ORDER — AMPHETAMINE-DEXTROAMPHETAMINE 20 MG PO TABS: ORAL_TABLET | ORAL | 0 refills | Status: DC

## 2021-07-30 NOTE — Telephone Encounter (Signed)
Resent.  Thanks.

## 2021-07-30 NOTE — Telephone Encounter (Signed)
Pt called back and I made him aware of message below and he said he didn't have any questions

## 2021-07-30 NOTE — Telephone Encounter (Signed)
Left VM for patient that rx was sent. 

## 2021-07-30 NOTE — Addendum Note (Signed)
Addended by: Joaquim Nam on: 07/30/2021 09:10 AM   Modules accepted: Orders

## 2021-08-30 ENCOUNTER — Other Ambulatory Visit: Payer: Self-pay | Admitting: Family Medicine

## 2021-08-30 NOTE — Telephone Encounter (Signed)
Refill request for amphetamine-dextroamphetamine (ADDERALL) 20 MG tablet ? ?LOV - 12/04/20 ?Next OV - not scheduled ?Last refill - 07/30/21 #90/0 ? ?

## 2021-08-30 NOTE — Telephone Encounter (Signed)
Mom called do not have in stock need to have sent to [facility] on [address].  ?

## 2021-09-01 MED ORDER — AMPHETAMINE-DEXTROAMPHETAMINE 20 MG PO TABS: ORAL_TABLET | ORAL | 0 refills | Status: DC

## 2021-09-01 NOTE — Telephone Encounter (Signed)
Sent. Thanks.   

## 2021-09-03 ENCOUNTER — Other Ambulatory Visit: Payer: Self-pay

## 2021-09-03 NOTE — Telephone Encounter (Signed)
Pt is calling for Adderall to [facility] in [place]. The rx sent to [facility] on [address] cannot be transferred due to controlled substance. ? ? ?

## 2021-09-04 MED ORDER — AMPHETAMINE-DEXTROAMPHETAMINE 20 MG PO TABS: ORAL_TABLET | ORAL | 0 refills | Status: DC

## 2021-09-04 NOTE — Telephone Encounter (Signed)
Rx has been cancelled.

## 2021-09-04 NOTE — Telephone Encounter (Signed)
Sent.  Please cancel the rx at [facility] on [address]. Loraine Leriche.  Thanks.  ?

## 2021-10-10 ENCOUNTER — Other Ambulatory Visit: Payer: Self-pay | Admitting: Family Medicine

## 2021-10-10 NOTE — Telephone Encounter (Signed)
Is this okay to refill? 

## 2021-10-10 NOTE — Telephone Encounter (Signed)
?  Encourage patient to contact the pharmacy for refills or they can request refills through [facility] ? ?LAST APPOINTMENT DATE:  Please schedule appointment if longer than 1 year ? ?NEXT APPOINTMENT DATE: ? ?MEDICATION:amphetamine-dextroamphetamine (ADDERALL) 20 MG tablet ? ?Is the patient out of medication?  ? ?PHARMACY:[facility] - [place], [place] - [address] ? ?Let patient know to contact pharmacy at the end of the day to make sure medication is ready. ? ?Please notify patient to allow 48-72 hours to process ? ?CLINICAL FILLS OUT ALL BELOW:  ? ?LAST REFILL: ? ?QTY: ? ?REFILL DATE: ? ? ? ?OTHER COMMENTS:  ? ? ?Okay for refill? ? ?Please advise ? ? ?  ?

## 2021-10-11 MED ORDER — AMPHETAMINE-DEXTROAMPHETAMINE 20 MG PO TABS: ORAL_TABLET | ORAL | 0 refills | Status: DC

## 2021-10-11 NOTE — Telephone Encounter (Signed)
Pt's mother called and says that [facility] not [facility] the adder[facility] in stock now but [facility] [place] [facility] so could we please reroute it to that pharmacy.  ?

## 2021-10-11 NOTE — Addendum Note (Signed)
Addended by: Erby Pian on: 10/11/2021 02:38 PM ? ? Modules accepted: Orders ? ?

## 2021-10-11 NOTE — Addendum Note (Signed)
Addended by: Joaquim Nam on: 10/11/2021 08:32 AM ? ? Modules accepted: Orders ? ?

## 2021-10-11 NOTE — Telephone Encounter (Signed)
Sent [facility] [place] ?

## 2021-10-11 NOTE — Telephone Encounter (Signed)
Sent. Thanks.   

## 2021-11-10 ENCOUNTER — Other Ambulatory Visit: Payer: Self-pay | Admitting: Family Medicine

## 2021-11-11 NOTE — Telephone Encounter (Signed)
Refill request for amphetamine-dextroamphetamine (ADDERALL) 20 MG tablet ? ?LOV - 12/04/20 ?Next OV - not scheduled ?Last refill - 10/11/21 #90/0 ? ?

## 2021-11-12 MED ORDER — AMPHETAMINE-DEXTROAMPHETAMINE 20 MG PO TABS: ORAL_TABLET | ORAL | 0 refills | Status: DC

## 2021-11-12 NOTE — Telephone Encounter (Signed)
[facility] Night - Client ?TELEPHONE ADVICE RECORD ?AccessNurse? ?Patient ?Name: ?Bradley ?R Collins ?Gender: Male ?DOB: 1985/11/15 ?Age: 36 Y 6 M 15 D ?Return ?Phone ?Number: ?[phone] ?(Primary) ?Address: ?City/ ?State/ ?Zip: ?[place] ?Client [facility] Night - Client ?Client Site [facility] - Night ?Provider Raechel Ache - MD ?Contact Type Call ?Who Is Calling Patient / Member / Family / Caregiver ?Call Type Triage / Clinical ?Caller Name Arline Asp ?Relationship To Patient Mother ?Return Phone Number [phone] (Primary) ?Chief Complaint Prescription Refill or Medication Request (non ?symptomatic) ?Reason for Call Medication Question / Request ?Initial Comment Caller states she wants to know if her son's ?medication has been called in. It is his Adderall ?medication. ?Additional Comment Caller does not know current zip code for patient. ?Translation No ?Nurse Assessment ?Nurse: Shea Evans, RN, Tiffani Date/Time Lamount Cohen Time): 11/11/2021 6:04:01 PM ?Confirm and document reason for call. If ?symptomatic, describe symptoms. ?---Caller states she wants to know if her son's ?medication has been called in. It is his Adderall ?medication. Denies any symptoms at this time. ?Does the patient have any new or worsening ?symptoms? ---No ?Please document clinical information provided and ?list any resource used. ?---Advised patient mother to call office when they ?open for Adderall rx, otherwise call pharmacy to ?check to see if its been called in. Caller verbalized ?understanding. ?Disp. Time (Eastern ?Time) Disposition Final User ?11/11/2021 6:06:52 PM Clinical Call Yes Dunn, RN, Tiffan ?

## 2021-11-12 NOTE — Telephone Encounter (Signed)
Pts mom (DPR signed) calling to see if adderall was refilled and to which pharmacy. Notified adderall refilled 11/12/21 to [facility] [place] and pt will cb for appt.  ?

## 2021-12-13 ENCOUNTER — Other Ambulatory Visit: Payer: Self-pay | Admitting: Family Medicine

## 2021-12-13 NOTE — Telephone Encounter (Signed)
Refill request for amphetamine-dextroamphetamine (ADDERALL) 20 MG tablet   LOV - 12/04/20 Next OV - not scheduled Last refill - 11/12/21 #90/0

## 2021-12-14 MED ORDER — AMPHETAMINE-DEXTROAMPHETAMINE 20 MG PO TABS: ORAL_TABLET | ORAL | 0 refills | Status: DC

## 2021-12-14 NOTE — Telephone Encounter (Signed)
Sent. Thanks.   

## 2022-01-13 ENCOUNTER — Telehealth: Payer: Self-pay | Admitting: Family Medicine

## 2022-01-14 ENCOUNTER — Other Ambulatory Visit: Payer: Self-pay | Admitting: Family Medicine

## 2022-01-14 NOTE — Telephone Encounter (Signed)
Refill request for   amphetamine-dextroamphetamine (ADDERALL) 20 MG tablet      LOV - 12/04/20 Next OV - not scheduled Last refill - 12/14/21 #90/0

## 2022-01-14 NOTE — Telephone Encounter (Signed)
Called patient and VM full, sent message to MyChart for patient to give [place] a call.

## 2022-01-14 NOTE — Telephone Encounter (Signed)
Patient is overdue for CPE. Please call to schedule.  

## 2022-01-15 ENCOUNTER — Other Ambulatory Visit: Payer: Self-pay | Admitting: Family Medicine

## 2022-01-15 MED ORDER — AMPHETAMINE-DEXTROAMPHETAMINE 20 MG PO TABS: ORAL_TABLET | ORAL | 0 refills | Status: DC

## 2022-02-15 ENCOUNTER — Other Ambulatory Visit: Payer: Self-pay | Admitting: Family Medicine

## 2022-02-16 ENCOUNTER — Other Ambulatory Visit: Payer: Self-pay | Admitting: Family Medicine

## 2022-02-17 NOTE — Telephone Encounter (Signed)
Refill request for amphetamine-dextroamphetamine (ADDERALL) 20 MG tablet  LOV - 12/04/20 Next OV - not scheduled Last refill - 01/15/22 #90/0

## 2022-02-17 NOTE — Telephone Encounter (Signed)
Duplicate request I can not refuse in EMR

## 2022-02-18 MED ORDER — AMPHETAMINE-DEXTROAMPHETAMINE 20 MG PO TABS: ORAL_TABLET | ORAL | 0 refills | Status: DC

## 2022-03-21 ENCOUNTER — Telehealth: Payer: Self-pay | Admitting: Family Medicine

## 2022-03-21 NOTE — Telephone Encounter (Signed)
Refill request for amphetamine-dextroamphetamine (ADDERALL) 20 MG tablet  LOV - 12/04/20 Next OV - not scheduled Last refill - 02/18/22 #90/0

## 2022-03-21 NOTE — Telephone Encounter (Signed)
Called pt, no answer, v/m was full

## 2022-03-21 NOTE — Telephone Encounter (Signed)
Patient is overdue for CPE with Dr. Damita Dunnings. Please call to schedule appt.

## 2022-03-23 NOTE — Telephone Encounter (Signed)
We can fill this when he schedules f/u.  Last rx from 02/18/22 stated he needed f/u.  Thanks.

## 2022-03-24 NOTE — Telephone Encounter (Signed)
Called pt again, no answer. Vm is still full

## 2022-03-24 NOTE — Telephone Encounter (Signed)
Please try to contact patient again. Can not fill medication until appointment is scheduled.

## 2022-03-25 MED ORDER — AMPHETAMINE-DEXTROAMPHETAMINE 20 MG PO TABS
ORAL_TABLET | ORAL | 0 refills | Status: DC
Start: 2022-03-25 — End: 2022-04-26

## 2022-03-25 NOTE — Addendum Note (Signed)
Addended by: Tonia Ghent on: 03/25/2022 09:07 AM   Modules accepted: Orders

## 2022-03-25 NOTE — Telephone Encounter (Signed)
Sent. Thanks.   

## 2022-03-25 NOTE — Telephone Encounter (Signed)
Scheduled f/u for 04/01/22

## 2022-04-01 ENCOUNTER — Ambulatory Visit (INDEPENDENT_AMBULATORY_CARE_PROVIDER_SITE_OTHER): Payer: Medicare HMO | Admitting: Family Medicine

## 2022-04-01 ENCOUNTER — Encounter: Payer: Self-pay | Admitting: Family Medicine

## 2022-04-01 VITALS — BP 110/84 | HR 83 | Temp 97.6°F | Ht 72.0 in | Wt 200.0 lb

## 2022-04-01 DIAGNOSIS — Z0000 Encounter for general adult medical examination without abnormal findings: Secondary | ICD-10-CM

## 2022-04-01 DIAGNOSIS — F988 Other specified behavioral and emotional disorders with onset usually occurring in childhood and adolescence: Secondary | ICD-10-CM

## 2022-04-01 DIAGNOSIS — R69 Illness, unspecified: Secondary | ICD-10-CM | POA: Diagnosis not present

## 2022-04-01 DIAGNOSIS — Z7189 Other specified counseling: Secondary | ICD-10-CM

## 2022-04-01 NOTE — Patient Instructions (Signed)
Go to the lab on the way out.   If you have mychart we'll likely use that to update you.    Take care.  Glad to see you. I would get a flu shot each fall.   

## 2022-04-01 NOTE — Progress Notes (Unsigned)
CPE- See plan.  Routine anticipatory guidance given to patient.  See health maintenance.  The possibility exists that previously documented standard health maintenance information may have been brought forward from a previous encounter into this note.  If needed, that same information has been updated to reflect the current situation based on today's encounter.    ADD.   h/o TBI.  Compliant with meds: yes, still on adderall.  benefit from med (ie increase in concentration): yes- he clearly does worse off adderall.  "I can't focus or get anything done" off med.   change in mood: no  change in appetite: no  Insomnia: no  Tremor: prev faint R 2nd finger tremor with hand in extension- not noted on exam 04/01/22.  No tremor at rest.   compliant with behavioral modification: he is making lists.  He isn't worse overall in the recent past. He is compensating with lists, etc He is driving well, not getting lost. No red flag events. Not leaving the stove on, etc. Cautions d/w pt.   All routine vaccines encouraged.  Colon and prostate cancer screening not due.   Still out of disability.  Doing well at home.   Diet and exercise d/w pt.   Advance directive discussed with patient. Wife designated if patient were incapacitated.   PMH and SH reviewed  Meds, vitals, and allergies reviewed.   ROS: Per HPI.  Unless specifically indicated otherwise in HPI, the patient denies:  General: fever. Eyes: acute vision changes ENT: sore throat Cardiovascular: chest pain Respiratory: SOB GI: vomiting GU: dysuria Musculoskeletal: acute back pain Derm: acute rash Neuro: acute motor dysfunction Psych: worsening mood Endocrine: polydipsia Heme: bleeding Allergy: hayfever  GEN: nad, alert and oriented HEENT: ncat NECK: supple w/o LA CV: rrr. PULM: ctab, no inc wob ABD: soft, +bs EXT: no edema SKIN: no acute rash No tremor.

## 2022-04-02 DIAGNOSIS — Z0000 Encounter for general adult medical examination without abnormal findings: Secondary | ICD-10-CM | POA: Insufficient documentation

## 2022-04-02 LAB — CBC WITH DIFFERENTIAL/PLATELET
Basophils Absolute: 0 10*3/uL (ref 0.0–0.1)
Basophils Relative: 0.6 % (ref 0.0–3.0)
Eosinophils Absolute: 0.2 10*3/uL (ref 0.0–0.7)
Eosinophils Relative: 2.5 % (ref 0.0–5.0)
HCT: 43.4 % (ref 39.0–52.0)
Hemoglobin: 14.5 g/dL (ref 13.0–17.0)
Lymphocytes Relative: 21.1 % (ref 12.0–46.0)
Lymphs Abs: 1.6 10*3/uL (ref 0.7–4.0)
MCHC: 33.4 g/dL (ref 30.0–36.0)
MCV: 82.1 fl (ref 78.0–100.0)
Monocytes Absolute: 0.5 10*3/uL (ref 0.1–1.0)
Monocytes Relative: 7.2 % (ref 3.0–12.0)
Neutro Abs: 5.1 10*3/uL (ref 1.4–7.7)
Neutrophils Relative %: 68.6 % (ref 43.0–77.0)
Platelets: 237 10*3/uL (ref 150.0–400.0)
RBC: 5.29 Mil/uL (ref 4.22–5.81)
RDW: 13.7 % (ref 11.5–15.5)
WBC: 7.4 10*3/uL (ref 4.0–10.5)

## 2022-04-02 LAB — BASIC METABOLIC PANEL
BUN: 10 mg/dL (ref 6–23)
CO2: 31 mEq/L (ref 19–32)
Calcium: 9.6 mg/dL (ref 8.4–10.5)
Chloride: 102 mEq/L (ref 96–112)
Creatinine, Ser: 0.97 mg/dL (ref 0.40–1.50)
GFR: 100.14 mL/min (ref >60.00)
Glucose, Bld: 90 mg/dL (ref 70–99)
Potassium: 4 mEq/L (ref 3.5–5.1)
Sodium: 140 mEq/L (ref 135–145)

## 2022-04-02 LAB — TSH: TSH: 0.7 u[IU]/mL (ref 0.35–5.50)

## 2022-04-02 NOTE — Assessment & Plan Note (Signed)
All routine vaccines encouraged.  Colon and prostate cancer screening not due.   Still out of disability.  Doing well at home.   Diet and exercise d/w pt.   Advance directive discussed with patient. Wife designated if patient were incapacitated.

## 2022-04-02 NOTE — Assessment & Plan Note (Signed)
Would continue Adderall as is with ongoing behavioral modification such as making lists.  He will update me as needed.

## 2022-04-02 NOTE — Assessment & Plan Note (Signed)
Advance directive discussed with patient.  Wife designated if patient were incapacitated. 

## 2022-04-08 ENCOUNTER — Telehealth: Payer: Self-pay | Admitting: Family Medicine

## 2022-04-08 NOTE — Telephone Encounter (Signed)
Left message for patient to call back and schedule Medicare Annual Wellness Visit (AWV).  Pease offer to do virtually or by telephone.   No hx of AWV - eligible as of 02/28/2010  Please schedule at anytime with [facility]   45 minute appointment  Any questions, please contact me at [phone]

## 2022-04-26 ENCOUNTER — Other Ambulatory Visit: Payer: Self-pay | Admitting: Family Medicine

## 2022-04-27 ENCOUNTER — Other Ambulatory Visit: Payer: Self-pay | Admitting: Family Medicine

## 2022-04-28 NOTE — Telephone Encounter (Signed)
Refill request for amphetamine-dextroamphetamine (ADDERALL) 20 MG tablet  LOV - 04/01/22 Next OV - not scheduled Last refill - 03/25/22 #90/0

## 2022-04-28 NOTE — Telephone Encounter (Signed)
Duplicate request

## 2022-04-29 MED ORDER — AMPHETAMINE-DEXTROAMPHETAMINE 20 MG PO TABS
ORAL_TABLET | ORAL | 0 refills | Status: DC
Start: 2022-04-29 — End: 2022-05-30

## 2022-04-29 NOTE — Telephone Encounter (Signed)
Sent via other note.

## 2022-05-30 ENCOUNTER — Other Ambulatory Visit: Payer: Self-pay | Admitting: Family Medicine

## 2022-05-30 NOTE — Telephone Encounter (Signed)
Refill request for amphetamine-dextroamphetamine (ADDERALL) 20 MG tablet   LOV - 04/01/22 Next OV - not scheduled Last refill - 04/29/22 #90/0

## 2022-06-01 MED ORDER — AMPHETAMINE-DEXTROAMPHETAMINE 20 MG PO TABS: ORAL_TABLET | ORAL | 0 refills | Status: DC

## 2022-07-02 ENCOUNTER — Other Ambulatory Visit: Payer: Self-pay | Admitting: Family Medicine

## 2022-07-02 MED ORDER — AMPHETAMINE-DEXTROAMPHETAMINE 20 MG PO TABS
ORAL_TABLET | ORAL | 0 refills | Status: DC
Start: 2022-07-02 — End: 2022-08-02

## 2022-07-02 NOTE — Telephone Encounter (Signed)
Last filled 06-01-22 #90 Last OV 04-01-22 No Future OV [facility]

## 2022-07-02 NOTE — Telephone Encounter (Signed)
Sent. Thanks.   

## 2022-07-29 ENCOUNTER — Telehealth: Payer: Self-pay | Admitting: Family Medicine

## 2022-07-29 NOTE — Telephone Encounter (Signed)
LVM for pt ot rtn my call to schedule AWV with [facility]

## 2022-08-02 ENCOUNTER — Other Ambulatory Visit: Payer: Self-pay | Admitting: Family Medicine

## 2022-08-04 ENCOUNTER — Other Ambulatory Visit: Payer: Self-pay | Admitting: Family Medicine

## 2022-08-04 MED ORDER — AMPHETAMINE-DEXTROAMPHETAMINE 20 MG PO TABS: ORAL_TABLET | ORAL | 0 refills | Status: DC

## 2022-08-04 NOTE — Telephone Encounter (Signed)
Refill request for amphetamine-dextroamphetamine (ADDERALL) 20 MG tablet   LOV - 04/01/22 Next OV - not scheduled Last refill - 07/02/22 #90/0

## 2022-08-06 NOTE — Telephone Encounter (Signed)
[facility] and [facility] did receive rx and patient picked it up today.

## 2022-08-06 NOTE — Telephone Encounter (Signed)
Please check with pharmacy.  [id number] [address], [place], [place] [address] Phone: [phone]  Fax: [phone]   Rx was prev sent:  E-Prescribing Status: Receipt confirmed by pharmacy (08/04/2022 10:16 PM EST)   Let me know if pharmacy didn't get rx.  Thanks.

## 2022-09-02 ENCOUNTER — Other Ambulatory Visit: Payer: Self-pay | Admitting: Family Medicine

## 2022-09-02 NOTE — Telephone Encounter (Signed)
Refill request for amphetamine-dextroamphetamine (ADDERALL) 20 MG tablet   LOV - 04/01/22 Next OV - not scheduled Last refill - 08/04/22 #90/0

## 2022-09-03 MED ORDER — AMPHETAMINE-DEXTROAMPHETAMINE 20 MG PO TABS: ORAL_TABLET | ORAL | 0 refills | Status: DC

## 2022-10-04 ENCOUNTER — Other Ambulatory Visit: Payer: Self-pay | Admitting: Family Medicine

## 2022-10-06 NOTE — Telephone Encounter (Signed)
Refill request for   amphetamine-dextroamphetamine (ADDERALL) 20 MG tablet    LOV - 04/01/22 Next OV - not scheduled Last refill - 09/03/22 #90/0

## 2022-10-07 MED ORDER — AMPHETAMINE-DEXTROAMPHETAMINE 20 MG PO TABS: ORAL_TABLET | ORAL | 0 refills | Status: DC

## 2022-11-03 ENCOUNTER — Telehealth: Payer: Self-pay | Admitting: Family Medicine

## 2022-11-03 NOTE — Telephone Encounter (Signed)
Contacted Jacelyn Pi to schedule their annual wellness visit. Appointment made for 11/19/2022.  [facility] Care Guide [facility] AWV TEAM Direct Dial: [phone]

## 2022-11-06 ENCOUNTER — Other Ambulatory Visit: Payer: Self-pay | Admitting: Family Medicine

## 2022-11-06 NOTE — Telephone Encounter (Signed)
Duplicate request; please deny once other is approved.

## 2022-11-06 NOTE — Telephone Encounter (Signed)
Refill request for   amphetamine-dextroamphetamine (ADDERALL) 20 MG tablet      LOV - 04/01/22 Next OV - not scheduled Last refill - 10/07/22 #90/0

## 2022-11-07 MED ORDER — AMPHETAMINE-DEXTROAMPHETAMINE 20 MG PO TABS: ORAL_TABLET | ORAL | 0 refills | Status: DC

## 2022-11-19 ENCOUNTER — Ambulatory Visit (INDEPENDENT_AMBULATORY_CARE_PROVIDER_SITE_OTHER): Payer: Medicare HMO

## 2022-11-19 VITALS — Ht 72.0 in | Wt 205.0 lb

## 2022-11-19 DIAGNOSIS — Z0000 Encounter for general adult medical examination without abnormal findings: Secondary | ICD-10-CM

## 2022-11-19 NOTE — Patient Instructions (Signed)
Mr. [name] , Thank you for taking time to come for your Medicare Wellness Visit. I appreciate your ongoing commitment to your health goals. Please review the following plan we discussed and let me know if I can assist you in the future.   These are the goals we discussed:  Goals      Patient Stated     No new goals        This is a list of the screening recommended for you and due dates:  Health Maintenance  Topic Date Due   HIV Screening  Never done   Hepatitis C Screening: USPSTF Recommendation to screen - Ages 53-79 yo.  Never done   Flu Shot  01/29/2023   Medicare Annual Wellness Visit  11/19/2023   DTaP/Tdap/Td vaccine (2 - Td or Tdap) 12/05/2030   HPV Vaccine  Aged Out   COVID-19 Vaccine  Discontinued    Advanced directives: Advance directive discussed with you today. Even though you declined this today, please call our office should you change your mind, and we can give you the proper paperwork for you to fill out.   Conditions/risks identified: none  Next appointment: Follow up in one year for your annual wellness visit 11/24/23 @ 9:45 televisit  Preventive Care 55-38 Years Old, Male Preventive care refers to lifestyle choices and visits with your health care provider that can promote health and wellness. Preventive care visits are also called wellness exams. What can I expect for my preventive care visit? Counseling During your preventive care visit, your health care provider may ask about your: Medical history, including: Past medical problems. Family medical history. Current health, including: Emotional well-being. Home life and relationship well-being. Sexual activity. Lifestyle, including: Alcohol, nicotine or tobacco, and drug use. Access to firearms. Diet, exercise, and sleep habits. Safety issues such as seatbelt and bike helmet use. Sunscreen use. Work and work Astronomer. Physical exam Your health care provider may check your: Height and weight.  These may be used to calculate your BMI (body mass index). BMI is a measurement that tells if you are at a healthy weight. Waist circumference. This measures the distance around your waistline. This measurement also tells if you are at a healthy weight and may help predict your risk of certain diseases, such as type 2 diabetes and high blood pressure. Heart rate and blood pressure. Body temperature. Skin for abnormal spots. What immunizations do I need? Vaccines are usually given at various ages, according to a schedule. Your health care provider will recommend vaccines for you based on your age, medical history, and lifestyle or other factors, such as travel or where you work. What tests do I need? Screening Your health care provider may recommend screening tests for certain conditions. This may include: Lipid and cholesterol levels. Diabetes screening. This is done by checking your blood sugar (glucose) after you have not eaten for a while (fasting). Hepatitis B test. Hepatitis C test. HIV (human immunodeficiency virus) test. STI (sexually transmitted infection) testing, if you are at risk. Talk with your health care provider about your test results, treatment options, and if necessary, the need for more tests. Follow these instructions at home: Eating and drinking  Eat a healthy diet that includes fresh fruits and vegetables, whole grains, lean protein, and low-fat dairy products. Drink enough fluid to keep your urine pale yellow. Take vitamin and mineral supplements as recommended by your health care provider. Do not drink alcohol if your health care provider tells you not to  drink. If you drink alcohol: Limit how much you have to 0-2 drinks a day. Know how much alcohol is in your drink. In the [place] one drink equals one 12 oz bottle of beer (355 mL), one 5 oz glass of wine (148 mL), or one 1 oz glass of hard liquor (44 mL). Lifestyle Brush your teeth every morning and night with  fluoride toothpaste. Floss one time each day. Exercise for at least 30 minutes 5 or more days each week. Do not use any products that contain nicotine or tobacco. These products include cigarettes, chewing tobacco, and vaping devices, such as e-cigarettes. If you need help quitting, ask your health care provider. Do not use drugs. If you are sexually active, practice safe sex. Use a condom or other form of protection to prevent STIs. Find healthy ways to manage stress, such as: Meditation, yoga, or listening to music. Journaling. Talking to a trusted person. Spending time with friends and family. Minimize exposure to UV radiation to reduce your risk of skin cancer. Safety Always wear your seat belt while driving or riding in a vehicle. Do not drive: If you have been drinking alcohol. Do not ride with someone who has been drinking. If you have been using any mind-altering substances or drugs. While texting. When you are tired or distracted. Wear a helmet and other protective equipment during sports activities. If you have firearms in your house, make sure you follow all gun safety procedures. Seek help if you have been physically or sexually abused. What's next? Go to your health care provider once a year for an annual wellness visit. Ask your health care provider how often you should have your eyes and teeth checked. Stay up to date on all vaccines. This information is not intended to replace advice given to you by your health care provider. Make sure you discuss any questions you have with your health care provider. Document Revised: 12/12/2020 Document Reviewed: 12/12/2020 Elsevier Patient Education  2022 [employer].
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
## 2022-12-08 ENCOUNTER — Other Ambulatory Visit: Payer: Self-pay | Admitting: Family Medicine

## 2022-12-08 NOTE — Telephone Encounter (Signed)
LOV - 04/01/22 Next OV - not scheduled Last refill - 11/07/22 #90/0

## 2022-12-09 MED ORDER — AMPHETAMINE-DEXTROAMPHETAMINE 20 MG PO TABS: ORAL_TABLET | ORAL | 0 refills | Status: DC

## 2023-01-09 ENCOUNTER — Other Ambulatory Visit: Payer: Self-pay | Admitting: Family Medicine

## 2023-01-12 NOTE — Telephone Encounter (Signed)
Refill request for amphetamine-dextroamphetamine (ADDERALL) 20 MG tablet   LOV - 04/01/22 Next OV - not scheduled Last refill - 12/09/22 #90/0

## 2023-01-13 MED ORDER — AMPHETAMINE-DEXTROAMPHETAMINE 20 MG PO TABS: ORAL_TABLET | ORAL | 0 refills | Status: DC

## 2023-02-13 ENCOUNTER — Other Ambulatory Visit: Payer: Self-pay | Admitting: Family Medicine

## 2023-02-13 NOTE — Telephone Encounter (Signed)
Refill request for amphetamine-dextroamphetamine (ADDERALL) 20 MG tablet   LOV - 04/01/22 Next OV - not scheduled Last refill - 01/13/23 #90/0

## 2023-02-13 NOTE — Telephone Encounter (Signed)
Scheduled pt's cpe for 04/03/23

## 2023-02-13 NOTE — Telephone Encounter (Signed)
Patient needs CPE set up for October. Please call patient to schedule

## 2023-02-13 NOTE — Telephone Encounter (Signed)
Lvmtcb, sent mychart message  

## 2023-02-15 MED ORDER — AMPHETAMINE-DEXTROAMPHETAMINE 20 MG PO TABS: ORAL_TABLET | ORAL | 0 refills | Status: DC

## 2023-03-18 ENCOUNTER — Other Ambulatory Visit: Payer: Self-pay | Admitting: Family Medicine

## 2023-03-18 MED ORDER — AMPHETAMINE-DEXTROAMPHETAMINE 20 MG PO TABS: ORAL_TABLET | ORAL | 0 refills | Status: DC

## 2023-03-22 ENCOUNTER — Other Ambulatory Visit: Payer: Self-pay | Admitting: Family Medicine

## 2023-03-22 DIAGNOSIS — F988 Other specified behavioral and emotional disorders with onset usually occurring in childhood and adolescence: Secondary | ICD-10-CM

## 2023-03-27 ENCOUNTER — Other Ambulatory Visit: Payer: Medicare HMO

## 2023-04-03 ENCOUNTER — Encounter: Payer: Medicare HMO | Admitting: Family Medicine

## 2023-04-23 ENCOUNTER — Encounter: Payer: Self-pay | Admitting: Family Medicine

## 2023-04-23 ENCOUNTER — Ambulatory Visit (INDEPENDENT_AMBULATORY_CARE_PROVIDER_SITE_OTHER): Payer: Medicare HMO | Admitting: Family Medicine

## 2023-04-23 VITALS — BP 122/80 | HR 81 | Temp 98.9°F | Ht 73.0 in | Wt 203.4 lb

## 2023-04-23 DIAGNOSIS — Z0000 Encounter for general adult medical examination without abnormal findings: Secondary | ICD-10-CM | POA: Diagnosis not present

## 2023-04-23 DIAGNOSIS — F988 Other specified behavioral and emotional disorders with onset usually occurring in childhood and adolescence: Secondary | ICD-10-CM | POA: Diagnosis not present

## 2023-04-23 DIAGNOSIS — Z23 Encounter for immunization: Secondary | ICD-10-CM

## 2023-04-23 DIAGNOSIS — Z7189 Other specified counseling: Secondary | ICD-10-CM

## 2023-04-23 DIAGNOSIS — Z13220 Encounter for screening for lipoid disorders: Secondary | ICD-10-CM | POA: Diagnosis not present

## 2023-04-23 LAB — CBC WITH DIFFERENTIAL/PLATELET
Basophils Absolute: 0.1 10*3/uL (ref 0.0–0.1)
Basophils Relative: 0.7 % (ref 0.0–3.0)
Eosinophils Absolute: 0.2 10*3/uL (ref 0.0–0.7)
Eosinophils Relative: 2 % (ref 0.0–5.0)
HCT: 46.1 % (ref 39.0–52.0)
Hemoglobin: 14.8 g/dL (ref 13.0–17.0)
Lymphocytes Relative: 16.9 % (ref 12.0–46.0)
Lymphs Abs: 1.5 10*3/uL (ref 0.7–4.0)
MCHC: 32.2 g/dL (ref 30.0–36.0)
MCV: 82.8 fL (ref 78.0–100.0)
Monocytes Absolute: 0.8 10*3/uL (ref 0.1–1.0)
Monocytes Relative: 8.8 % (ref 3.0–12.0)
Neutro Abs: 6.3 10*3/uL (ref 1.4–7.7)
Neutrophils Relative %: 71.6 % (ref 43.0–77.0)
Platelets: 246 10*3/uL (ref 150.0–400.0)
RBC: 5.57 Mil/uL (ref 4.22–5.81)
RDW: 13.5 % (ref 11.5–15.5)
WBC: 8.8 10*3/uL (ref 4.0–10.5)

## 2023-04-23 LAB — LIPID PANEL
Cholesterol: 165 mg/dL (ref 0–200)
HDL: 34.9 mg/dL — ABNORMAL LOW (ref >39.00)
LDL Cholesterol: 92 mg/dL (ref 0–99)
NonHDL: 130.34
Total CHOL/HDL Ratio: 5
Triglycerides: 190 mg/dL — ABNORMAL HIGH (ref 0.0–149.0)
VLDL: 38 mg/dL (ref 0.0–40.0)

## 2023-04-23 LAB — BASIC METABOLIC PANEL
BUN: 9 mg/dL (ref 6–23)
CO2: 32 meq/L (ref 19–32)
Calcium: 9.4 mg/dL (ref 8.4–10.5)
Chloride: 104 meq/L (ref 96–112)
Creatinine, Ser: 1.04 mg/dL (ref 0.40–1.50)
GFR: 91.42 mL/min (ref >60.00)
Glucose, Bld: 94 mg/dL (ref 70–99)
Potassium: 3.9 meq/L (ref 3.5–5.1)
Sodium: 142 meq/L (ref 135–145)

## 2023-04-23 LAB — TSH: TSH: 0.83 u[IU]/mL (ref 0.35–5.50)

## 2023-04-23 MED ORDER — AMPHETAMINE-DEXTROAMPHETAMINE 20 MG PO TABS: ORAL_TABLET | ORAL | 0 refills | Status: DC

## 2023-04-23 NOTE — Patient Instructions (Addendum)
Go to the lab on the way out.   If you have mychart we'll likely use that to update you.    Take care.  Glad to see you. Let me know if you have trouble getting your refill today.  Let us know when you need refills.
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
## 2023-04-26 NOTE — Assessment & Plan Note (Signed)
Advance directive discussed with patient.  Wife designated if patient were incapacitated. 

## 2023-04-26 NOTE — Assessment & Plan Note (Signed)
He is driving well, not getting lost. No red flag events. Not leaving the stove on, etc. Cautions d/w pt.  No MVAs.    Continue Adderall.  Okay for outpatient follow-up.  Discussed coping strategies.  He can update me as needed.

## 2023-04-26 NOTE — Assessment & Plan Note (Signed)
All routine vaccines encouraged.  Flu shot today. Colon and prostate cancer screening not due.   Still out of disability.  Doing well at home.   Diet and exercise d/w pt.   Advance directive discussed with patient. Wife designated if patient were incapacitated.

## 2023-05-26 ENCOUNTER — Other Ambulatory Visit: Payer: Self-pay | Admitting: Family Medicine

## 2023-05-26 NOTE — Telephone Encounter (Signed)
last visit 04/23/23 CPE  Next visit n/a  last refill 04/23/23 #60 no rf

## 2023-05-27 MED ORDER — AMPHETAMINE-DEXTROAMPHETAMINE 20 MG PO TABS: ORAL_TABLET | ORAL | 0 refills | Status: DC

## 2023-06-26 ENCOUNTER — Other Ambulatory Visit: Payer: Self-pay | Admitting: Family Medicine

## 2023-06-29 NOTE — Telephone Encounter (Signed)
last visit 04/23/23 CPE  Next visit n/a  last refill 05/27/23 #90 tabs/  no refill

## 2023-06-30 MED ORDER — AMPHETAMINE-DEXTROAMPHETAMINE 20 MG PO TABS: ORAL_TABLET | ORAL | 0 refills | Status: DC

## 2023-08-01 ENCOUNTER — Other Ambulatory Visit: Payer: Self-pay | Admitting: Family Medicine

## 2023-08-03 ENCOUNTER — Other Ambulatory Visit: Payer: Self-pay | Admitting: Family Medicine

## 2023-08-03 NOTE — Telephone Encounter (Signed)
Last office visit: 04/23/23 Next office visit: nothing scheduled Last refill: This request was routed electronically. amphetamine-dextroamphetamine (ADDERALL) 20 MG tablet 06/30/23 90 tablets 0 refills

## 2023-08-04 MED ORDER — AMPHETAMINE-DEXTROAMPHETAMINE 20 MG PO TABS: ORAL_TABLET | ORAL | 0 refills | Status: DC

## 2023-09-01 ENCOUNTER — Other Ambulatory Visit: Payer: Self-pay | Admitting: Family Medicine

## 2023-09-01 NOTE — Telephone Encounter (Signed)
 Last office visit: 04/23/23 Next office visit: nothing scheduled Last refill: This request was routed electronically.amphetamine-dextroamphetamine (ADDERALL) 20 MG tablet  08/04/23 #90 w/ 0 refiils

## 2023-09-02 MED ORDER — AMPHETAMINE-DEXTROAMPHETAMINE 20 MG PO TABS: ORAL_TABLET | ORAL | 0 refills | Status: DC

## 2023-10-03 ENCOUNTER — Other Ambulatory Visit: Payer: Self-pay | Admitting: Family Medicine

## 2023-10-06 MED ORDER — AMPHETAMINE-DEXTROAMPHETAMINE 20 MG PO TABS: ORAL_TABLET | ORAL | 0 refills | Status: DC

## 2023-10-06 NOTE — Telephone Encounter (Signed)
 Sent. Thanks.

## 2023-10-30 DIAGNOSIS — S61441A Puncture wound with foreign body of right hand, initial encounter: Secondary | ICD-10-CM | POA: Diagnosis not present

## 2023-10-30 DIAGNOSIS — S60551A Superficial foreign body of right hand, initial encounter: Secondary | ICD-10-CM | POA: Diagnosis not present

## 2023-10-30 DIAGNOSIS — S61031A Puncture wound without foreign body of right thumb without damage to nail, initial encounter: Secondary | ICD-10-CM | POA: Diagnosis not present

## 2023-10-30 DIAGNOSIS — W458XXA Other foreign body or object entering through skin, initial encounter: Secondary | ICD-10-CM | POA: Diagnosis not present

## 2023-11-09 ENCOUNTER — Ambulatory Visit (INDEPENDENT_AMBULATORY_CARE_PROVIDER_SITE_OTHER): Admitting: Family Medicine

## 2023-11-09 VITALS — BP 122/64 | HR 89 | Temp 99.0°F | Ht 73.0 in | Wt 197.8 lb

## 2023-11-09 DIAGNOSIS — Z029 Encounter for administrative examinations, unspecified: Secondary | ICD-10-CM | POA: Diagnosis not present

## 2023-11-09 MED ORDER — AMPHETAMINE-DEXTROAMPHETAMINE 20 MG PO TABS: ORAL_TABLET | ORAL | 0 refills | Status: DC

## 2023-11-09 NOTE — Patient Instructions (Signed)
Take care.  Glad to see you.  
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
## 2023-11-11 DIAGNOSIS — Z029 Encounter for administrative examinations, unspecified: Secondary | ICD-10-CM | POA: Insufficient documentation

## 2023-11-11 NOTE — Assessment & Plan Note (Signed)
 When history of short-term memory changes that have been treated with Adderall. 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 See scanned forms.  He can update me as needed.

## 2023-11-24 ENCOUNTER — Ambulatory Visit (INDEPENDENT_AMBULATORY_CARE_PROVIDER_SITE_OTHER): Payer: Medicare HMO

## 2023-11-24 VITALS — Ht 73.0 in | Wt 197.0 lb

## 2023-11-24 DIAGNOSIS — Z0000 Encounter for general adult medical examination without abnormal findings: Secondary | ICD-10-CM

## 2023-11-24 NOTE — Patient Instructions (Signed)
 [name] , Thank you for taking time out of your busy schedule to complete your Annual Wellness Visit with me. I enjoyed our conversation and look forward to speaking with you again next year. I, as well as your care team,  appreciate your ongoing commitment to your health goals. Please review the following plan we discussed and let me know if I can assist you in the future. Your Game plan/ To Do List    Follow up Visits: Next Medicare AWV with our clinical staff: 5/28/6 @ 10:10am televisit   Have you seen your provider in the last 6 months (3 months if uncontrolled diabetes)? Yes Next Office Visit with your provider: 04/26/24 CPE  Clinician Recommendations:  Aim for 30 minutes of exercise or brisk walking, 6-8 glasses of water, and 5 servings of fruits and vegetables each day.       This is a list of the screening recommended for you and due dates:  Health Maintenance  Topic Date Due   HIV Screening  Never done   Hepatitis C Screening  Never done   Flu Shot  01/29/2024   Medicare Annual Wellness Visit  11/23/2024   DTaP/Tdap/Td vaccine (2 - Td or Tdap) 12/05/2030   HPV Vaccine  Aged Out   Meningitis B Vaccine  Aged Out   COVID-19 Vaccine  Discontinued    Advanced directives: (Declined) Advance directive discussed with you today. Even though you declined this today, please call our office should you change your mind, and we can give you the proper paperwork for you to fill out. Advance Care Planning is important because it:  [x]  Makes sure you receive the medical care that is consistent with your values, goals, and preferences  [x]  It provides guidance to your family and loved ones and reduces their decisional burden about whether or not they are making the right decisions based on your wishes.  Follow the link provided in your after visit summary or read over the paperwork we have mailed to you to help you started getting your Advance Directives in place. If you need assistance in  completing these, please reach out to us  so that we can help you!
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
## 2023-12-10 ENCOUNTER — Other Ambulatory Visit: Payer: Self-pay | Admitting: Family Medicine

## 2023-12-10 NOTE — Telephone Encounter (Signed)
 Copied from CRM [id number]. Topic: Clinical - Medication Refill >> Dec 10, 2023  9:14 AM Clyde Darling P wrote: Medication: amphetamine -dextroamphetamine  (ADDERALL) 20 MG tablet  Has the patient contacted their pharmacy? No- only get 1 refill. (Agent: If no, request that the patient contact the pharmacy for the refill. If patient does not wish to contact the pharmacy document the reason why and proceed with request.) (Agent: If yes, when and what did the pharmacy advise?)  This is the patient's preferred pharmacy:  [phone] [facility], [place] - [address] [place] [address] Phone: [phone] Fax: [phone]  Is this the correct pharmacy for this prescription? Yes If no, delete pharmacy and type the correct one.   Has the prescription been filled recently? No  Is the patient out of the medication? Yes- Pt has a day worth of pill  Has the patient been seen for an appointment in the last year OR does the patient have an upcoming appointment? Yes  Can we respond through MyChart? Yes  Agent: Please be advised that Rx refills may take up to 3 business days. We ask that you follow-up with your pharmacy.

## 2023-12-11 MED ORDER — AMPHETAMINE-DEXTROAMPHETAMINE 20 MG PO TABS: ORAL_TABLET | ORAL | 0 refills | Status: DC

## 2023-12-14 NOTE — Telephone Encounter (Signed)
 [name]

## 2023-12-14 NOTE — Telephone Encounter (Signed)
 I spoke with pt and rx already picked up nothing further needed.

## 2024-01-11 ENCOUNTER — Telehealth: Payer: Self-pay | Admitting: Family Medicine

## 2024-01-11 NOTE — Telephone Encounter (Unsigned)
 Copied from CRM [id number]. Topic: Clinical - Medication Refill >> Jan 11, 2024  5:09 PM Chiquita SQUIBB wrote: Medication: amphetamine -dextroamphetamine  (ADDERALL) 20 MG tablet [[id number]]  Has the patient contacted their pharmacy? Yes (Agent: If no, request that the patient contact the pharmacy for the refill. If patient does not wish to contact the pharmacy document the reason why and proceed with request.) (Agent: If yes, when and what did the pharmacy advise?)  This is the patient's preferred pharmacy:  [phone], [place] - [address] [place] [address] Phone: [phone] Fax: [phone]  Is this the correct pharmacy for this prescription? Yes If no, delete pharmacy and type the correct one.   Has the prescription been filled recently? No  Is the patient out of the medication? No- Patient has one left   Has the patient been seen for an appointment in the last year OR does the patient have an upcoming appointment? Yes  Can we respond through MyChart? Yes  Agent: Please be advised that Rx refills may take up to 3 business days. We ask that you follow-up with your pharmacy.

## 2024-01-13 MED ORDER — AMPHETAMINE-DEXTROAMPHETAMINE 20 MG PO TABS: ORAL_TABLET | ORAL | 0 refills | Status: DC

## 2024-01-13 NOTE — Telephone Encounter (Signed)
 Sent. Thanks.

## 2024-02-15 MED ORDER — AMPHETAMINE-DEXTROAMPHETAMINE 20 MG PO TABS: ORAL_TABLET | ORAL | 0 refills | Status: DC

## 2024-02-15 NOTE — Telephone Encounter (Signed)
 LOV: 11/09/2023 NOV: 04/26/2024 Last Refill: amphetamine -dextroamphetamine  (ADDERALL) 20 MG tablet 01/13/24 90 tablets 0 refills patient takes 20mg  by mouth 3 times a day.

## 2024-02-15 NOTE — Telephone Encounter (Signed)
 Sent. Thanks.

## 2024-02-15 NOTE — Addendum Note (Signed)
 Addended by: CLEATUS ARLYSS RAMAN on: 02/15/2024 02:20 PM   Modules accepted: Orders

## 2024-03-21 ENCOUNTER — Telehealth: Payer: Self-pay

## 2024-03-21 MED ORDER — AMPHETAMINE-DEXTROAMPHETAMINE 20 MG PO TABS: ORAL_TABLET | ORAL | 0 refills | Status: DC

## 2024-03-21 NOTE — Telephone Encounter (Signed)
 Sent. Thanks.

## 2024-03-21 NOTE — Telephone Encounter (Signed)
 Copied from CRM #[id number]. Topic: Clinical - Medication Question >> Mar 21, 2024 10:19 AM Dedra B wrote: Reason for CRM: Pt called to follow up on med refill request for Adderall. Pt is out. Informed pt that med refill request can take up to 3 business days.

## 2024-03-21 NOTE — Telephone Encounter (Signed)
 LOV: 11/09/2023 NOV: 04/26/2024 Last Refill: amphetamine -dextroamphetamine  (ADDERALL) 20 MG tablet  02/15/2024 90 tablets 0 refills

## 2024-03-21 NOTE — Addendum Note (Signed)
 Addended by: CLEATUS ARLYSS RAMAN on: 03/21/2024 05:51 PM   Modules accepted: Orders

## 2024-03-21 NOTE — Telephone Encounter (Signed)
 [name]

## 2024-04-19 ENCOUNTER — Telehealth: Payer: Self-pay

## 2024-04-19 DIAGNOSIS — R41840 Attention and concentration deficit: Secondary | ICD-10-CM

## 2024-04-19 NOTE — Addendum Note (Signed)
 Addended by: Christiaan Strebeck on: 04/19/2024 05:00 PM   Modules accepted: Orders

## 2024-04-19 NOTE — Telephone Encounter (Signed)
 Copied from CRM #[id number]. Topic: Clinical - Prescription Issue >> Apr 19, 2024  9:32 AM Rea ORN wrote: Reason for CRM: Pt is asking for a written prescription for amphetamine -dextroamphetamine  (ADDERALL) 20 MG tablet. It is due to be filled tomorrow and he is going out of town tomorrow. Please call back.

## 2024-04-19 NOTE — Telephone Encounter (Signed)
 Name of Medication:  Adderall Name of Pharmacy:  [facility] Last Fill or Written Date and Quantity:  03/21/24, #90 Last Office Visit and Type:  11/09/23 Next Office Visit and Type:  04/26/24, annual exam Last Controlled Substance Agreement Date:  none Last UDS:  none

## 2024-04-20 MED ORDER — AMPHETAMINE-DEXTROAMPHETAMINE 20 MG PO TABS: ORAL_TABLET | ORAL | 0 refills | Status: DC

## 2024-04-20 NOTE — Addendum Note (Signed)
 Addended by: CLEATUS ARLYSS RAMAN on: 04/20/2024 04:02 PM   Modules accepted: Orders

## 2024-04-20 NOTE — Telephone Encounter (Signed)
 I sent the refill electronically today.  I am not in clinic to sign the prescription as a hardcopy.

## 2024-04-20 NOTE — Telephone Encounter (Signed)
 Noted.   Spoke with pt notifying him rx was sent to pharmacy. Pt expresses his thanks.

## 2024-04-26 ENCOUNTER — Ambulatory Visit: Admitting: Family Medicine

## 2024-04-26 VITALS — BP 136/84 | HR 74 | Temp 97.9°F | Ht 72.0 in | Wt 207.4 lb

## 2024-04-26 DIAGNOSIS — Z0000 Encounter for general adult medical examination without abnormal findings: Secondary | ICD-10-CM

## 2024-04-26 DIAGNOSIS — R41840 Attention and concentration deficit: Secondary | ICD-10-CM

## 2024-04-26 DIAGNOSIS — Z23 Encounter for immunization: Secondary | ICD-10-CM

## 2024-04-26 DIAGNOSIS — Z7189 Other specified counseling: Secondary | ICD-10-CM

## 2024-04-26 MED ORDER — AMPHETAMINE-DEXTROAMPHETAMINE 20 MG PO TABS: ORAL_TABLET | ORAL | 0 refills | Status: DC

## 2024-04-26 NOTE — Progress Notes (Unsigned)
 CPE- See plan.  Routine anticipatory guidance given to patient.  See health maintenance.  The possibility exists that previously documented standard health maintenance information may have been brought forward from a previous encounter into this note.  If needed, that same information has been updated to reflect the current situation based on today's encounter.    All routine vaccines encouraged.  Flu shot today. Colon and prostate cancer screening not due.   Still out of disability.  Doing well at home.   Diet and exercise d/w pt.   Advance directive discussed with patient. Wife designated if patient were incapacitated.   Reasonable to defer labs 2025. D/w pt  ADD.  h/o TBI.  Compliant with meds: yes, still on adderall.  benefit from med (ie increase in concentration): yes- he clearly does better on adderall.  I can't function well off med.   change in mood: mood is worse off med, mood is better on med.  change in appetite: no  Insomnia: no  Tremor: no compliant with behavioral modification: he is making lists.  He isn't worse overall in the recent past, no sig change.   He is driving well, not getting lost. No red flag events. Not leaving the stove on, etc. Cautions d/w pt.  No MVAs.    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  He has split custody with his son.  Per patient report, the plan is still potentially to adopt the 4 foster kids (two are 60, two are 55 years old).  PMH and SH reviewed  Meds, vitals, and allergies reviewed.   ROS: Per HPI.  Unless specifically indicated otherwise in HPI, the patient denies:  General: fever. Eyes: acute vision changes ENT: sore throat Cardiovascular: chest pain Respiratory: SOB GI: vomiting GU: dysuria Musculoskeletal: acute back pain Derm: acute rash Neuro: acute motor dysfunction Psych: worsening mood Endocrine: polydipsia Heme: bleeding Allergy: hayfever  GEN: nad, alert and oriented HEENT: mucous membranes  moist NECK: supple w/o LA CV: rrr. PULM: ctab, no inc wob ABD: soft, +bs EXT: no edema SKIN: no acute rash

## 2024-04-26 NOTE — Patient Instructions (Signed)
 Defer labs today.  Take care.  Glad to see you.

## 2024-04-27 NOTE — Assessment & Plan Note (Signed)
 All routine vaccines encouraged.  Flu shot today. Colon and prostate cancer screening not due.   Still out of disability.  Doing well at home.   Diet and exercise d/w pt.   Advance directive discussed with patient. Wife designated if patient were incapacitated.   Reasonable to defer labs 2025. D/w pt

## 2024-04-27 NOTE — Assessment & Plan Note (Signed)
Advance directive discussed with patient.  Wife designated if patient were incapacitated. 

## 2024-04-27 NOTE — Assessment & Plan Note (Signed)
 Would continue Adderall as is.  Okay for outpatient follow-up.  He can update me as needed.  Medication has helped historically without any obvious adverse effect.

## 2024-06-24 ENCOUNTER — Other Ambulatory Visit: Payer: Self-pay | Admitting: Family Medicine

## 2024-06-24 DIAGNOSIS — R41840 Attention and concentration deficit: Secondary | ICD-10-CM

## 2024-06-24 NOTE — Telephone Encounter (Signed)
 Copied from CRM [id number]. Topic: Clinical - Medication Refill >> Jun 24, 2024  1:32 PM Paige D wrote: Medication: amphetamine -dextroamphetamine  (ADDERALL) 20 MG tablet  Has the patient contacted their pharmacy? Yes (Agent: If no, request that the patient contact the pharmacy for the refill. If patient does not wish to contact the pharmacy document the reason why and proceed with request.) (Agent: If yes, when and what did the pharmacy advise?)  This is the patient's preferred pharmacy:  [phone], [place] - [address] [place] [address] Phone: [phone] Fax: [phone]  Is this the correct pharmacy for this prescription? Yes If no, delete pharmacy and type the correct one.   Has the prescription been filled recently? Yes  Is the patient out of the medication? Yes  Has the patient been seen for an appointment in the last year OR does the patient have an upcoming appointment? Yes  Can we respond through MyChart? Yes  Agent: Please be advised that Rx refills may take up to 3 business days. We ask that you follow-up with your pharmacy.

## 2024-06-27 NOTE — Telephone Encounter (Signed)
 Requesting: Amphetamine -dextroamphetamine  (ADDERALL) 20 MG Contract: No UDS: NONE Last Visit: 04/26/2024 Next Visit: Visit date not found Last Refill: 04/26/24  Please Advise

## 2024-07-03 MED ORDER — AMPHETAMINE-DEXTROAMPHETAMINE 20 MG PO TABS: ORAL_TABLET | ORAL | 0 refills | Status: AC

## 2024-11-24 ENCOUNTER — Ambulatory Visit

## 2024-11-29 ENCOUNTER — Ambulatory Visit
